# Patient Record
Sex: Male | Born: 1945 | Race: Black or African American | Hispanic: No | Marital: Married | Smoking: Former smoker
Health system: Southern US, Community
[De-identification: ages and names within clinical notes are randomized; demographics above are authoritative.]

## PROBLEM LIST (undated history)

## (undated) DIAGNOSIS — N289 Disorder of kidney and ureter, unspecified: Secondary | ICD-10-CM

## (undated) DIAGNOSIS — Z5189 Encounter for other specified aftercare: Secondary | ICD-10-CM

## (undated) DIAGNOSIS — I10 Essential (primary) hypertension: Secondary | ICD-10-CM

## (undated) DIAGNOSIS — N390 Urinary tract infection, site not specified: Secondary | ICD-10-CM

## (undated) DIAGNOSIS — E119 Type 2 diabetes mellitus without complications: Secondary | ICD-10-CM

## (undated) HISTORY — PX: BRAIN SURGERY: SHX531

## (undated) HISTORY — PX: NEPHRECTOMY TRANSPLANTED ORGAN: SUR880

---

## 2018-01-26 ENCOUNTER — Inpatient Hospital Stay (HOSPITAL_COMMUNITY)
Admission: EM | Admit: 2018-01-26 | Discharge: 2018-01-30 | DRG: 872 | Disposition: A | Discharge status: Skilled Nursing Facility | Payer: Medicare Other | Attending: Internal Medicine | Admitting: Internal Medicine

## 2018-01-26 ENCOUNTER — Emergency Department (HOSPITAL_COMMUNITY): Payer: Medicare Other

## 2018-01-26 ENCOUNTER — Encounter (HOSPITAL_COMMUNITY): Payer: Self-pay

## 2018-01-26 ENCOUNTER — Other Ambulatory Visit: Payer: Self-pay

## 2018-01-26 DIAGNOSIS — Z7952 Long term (current) use of systemic steroids: Secondary | ICD-10-CM

## 2018-01-26 DIAGNOSIS — Z91013 Allergy to seafood: Secondary | ICD-10-CM

## 2018-01-26 DIAGNOSIS — N3000 Acute cystitis without hematuria: Secondary | ICD-10-CM

## 2018-01-26 DIAGNOSIS — Z940 Kidney transplant status: Secondary | ICD-10-CM

## 2018-01-26 DIAGNOSIS — Y830 Surgical operation with transplant of whole organ as the cause of abnormal reaction of the patient, or of later complication, without mention of misadventure at the time of the procedure: Secondary | ICD-10-CM | POA: Diagnosis present

## 2018-01-26 DIAGNOSIS — E1129 Type 2 diabetes mellitus with other diabetic kidney complication: Secondary | ICD-10-CM | POA: Diagnosis present

## 2018-01-26 DIAGNOSIS — D62 Acute posthemorrhagic anemia: Secondary | ICD-10-CM | POA: Diagnosis present

## 2018-01-26 DIAGNOSIS — A419 Sepsis, unspecified organism: Secondary | ICD-10-CM | POA: Diagnosis present

## 2018-01-26 DIAGNOSIS — E1121 Type 2 diabetes mellitus with diabetic nephropathy: Secondary | ICD-10-CM

## 2018-01-26 DIAGNOSIS — T8613 Kidney transplant infection: Secondary | ICD-10-CM | POA: Diagnosis present

## 2018-01-26 DIAGNOSIS — J90 Pleural effusion, not elsewhere classified: Secondary | ICD-10-CM | POA: Diagnosis present

## 2018-01-26 DIAGNOSIS — I10 Essential (primary) hypertension: Secondary | ICD-10-CM | POA: Diagnosis not present

## 2018-01-26 DIAGNOSIS — E875 Hyperkalemia: Secondary | ICD-10-CM | POA: Diagnosis present

## 2018-01-26 DIAGNOSIS — Z87891 Personal history of nicotine dependence: Secondary | ICD-10-CM

## 2018-01-26 DIAGNOSIS — R319 Hematuria, unspecified: Secondary | ICD-10-CM | POA: Diagnosis present

## 2018-01-26 DIAGNOSIS — S42009A Fracture of unspecified part of unspecified clavicle, initial encounter for closed fracture: Secondary | ICD-10-CM

## 2018-01-26 DIAGNOSIS — I129 Hypertensive chronic kidney disease with stage 1 through stage 4 chronic kidney disease, or unspecified chronic kidney disease: Secondary | ICD-10-CM | POA: Diagnosis present

## 2018-01-26 DIAGNOSIS — M109 Gout, unspecified: Secondary | ICD-10-CM | POA: Diagnosis present

## 2018-01-26 DIAGNOSIS — D638 Anemia in other chronic diseases classified elsewhere: Secondary | ICD-10-CM | POA: Diagnosis present

## 2018-01-26 DIAGNOSIS — T8619 Other complication of kidney transplant: Secondary | ICD-10-CM | POA: Diagnosis present

## 2018-01-26 DIAGNOSIS — Z792 Long term (current) use of antibiotics: Secondary | ICD-10-CM

## 2018-01-26 DIAGNOSIS — Z79899 Other long term (current) drug therapy: Secondary | ICD-10-CM | POA: Diagnosis not present

## 2018-01-26 DIAGNOSIS — Z794 Long term (current) use of insulin: Secondary | ICD-10-CM

## 2018-01-26 DIAGNOSIS — S065X9D Traumatic subdural hemorrhage with loss of consciousness of unspecified duration, subsequent encounter: Secondary | ICD-10-CM | POA: Diagnosis not present

## 2018-01-26 DIAGNOSIS — N390 Urinary tract infection, site not specified: Secondary | ICD-10-CM

## 2018-01-26 DIAGNOSIS — N12 Tubulo-interstitial nephritis, not specified as acute or chronic: Secondary | ICD-10-CM | POA: Diagnosis present

## 2018-01-26 DIAGNOSIS — Z91018 Allergy to other foods: Secondary | ICD-10-CM | POA: Diagnosis not present

## 2018-01-26 DIAGNOSIS — N184 Chronic kidney disease, stage 4 (severe): Secondary | ICD-10-CM | POA: Diagnosis present

## 2018-01-26 DIAGNOSIS — S42122D Displaced fracture of acromial process, left shoulder, subsequent encounter for fracture with routine healing: Secondary | ICD-10-CM

## 2018-01-26 DIAGNOSIS — S22009D Unspecified fracture of unspecified thoracic vertebra, subsequent encounter for fracture with routine healing: Secondary | ICD-10-CM

## 2018-01-26 DIAGNOSIS — N179 Acute kidney failure, unspecified: Secondary | ICD-10-CM | POA: Diagnosis present

## 2018-01-26 DIAGNOSIS — E1122 Type 2 diabetes mellitus with diabetic chronic kidney disease: Secondary | ICD-10-CM | POA: Diagnosis present

## 2018-01-26 DIAGNOSIS — Z7982 Long term (current) use of aspirin: Secondary | ICD-10-CM | POA: Diagnosis not present

## 2018-01-26 HISTORY — DX: Disorder of kidney and ureter, unspecified: N28.9

## 2018-01-26 HISTORY — DX: Type 2 diabetes mellitus without complications: E11.9

## 2018-01-26 HISTORY — DX: Essential (primary) hypertension: I10

## 2018-01-26 HISTORY — DX: Encounter for other specified aftercare: Z51.89

## 2018-01-26 LAB — BASIC METABOLIC PANEL
Anion gap: 11 (ref 5–15)
BUN: 63 mg/dL — AB (ref 6–20)
CO2: 19 mmol/L — ABNORMAL LOW (ref 22–32)
CREATININE: 3.15 mg/dL — AB (ref 0.61–1.24)
Calcium: 10.1 mg/dL (ref 8.9–10.3)
Chloride: 105 mmol/L (ref 101–111)
GFR calc Af Amer: 21 mL/min — ABNORMAL LOW (ref >60)
GFR calc non Af Amer: 18 mL/min — ABNORMAL LOW (ref >60)
Glucose, Bld: 243 mg/dL — ABNORMAL HIGH (ref 65–99)
POTASSIUM: 6 mmol/L — AB (ref 3.5–5.1)
SODIUM: 135 mmol/L (ref 135–145)

## 2018-01-26 LAB — URINALYSIS, ROUTINE W REFLEX MICROSCOPIC
Bilirubin Urine: NEGATIVE
Glucose, UA: 50 mg/dL — AB
Ketones, ur: NEGATIVE mg/dL
Nitrite: NEGATIVE
PROTEIN: 100 mg/dL — AB
Specific Gravity, Urine: 1.015 (ref 1.005–1.030)
pH: 5 (ref 5.0–8.0)

## 2018-01-26 LAB — I-STAT CG4 LACTIC ACID, ED: Lactic Acid, Venous: 1.47 mmol/L (ref 0.5–1.9)

## 2018-01-26 LAB — CBC
HEMATOCRIT: 26.8 % — AB (ref 39.0–52.0)
Hemoglobin: 8.9 g/dL — ABNORMAL LOW (ref 13.0–17.0)
MCH: 23.5 pg — ABNORMAL LOW (ref 26.0–34.0)
MCHC: 33.2 g/dL (ref 30.0–36.0)
MCV: 70.7 fL — ABNORMAL LOW (ref 78.0–100.0)
PLATELETS: 134 10*3/uL — AB (ref 150–400)
RBC: 3.79 MIL/uL — ABNORMAL LOW (ref 4.22–5.81)
RDW: 22.9 % — AB (ref 11.5–15.5)
WBC: 19.4 10*3/uL — AB (ref 4.0–10.5)

## 2018-01-26 MED ORDER — SODIUM CHLORIDE 0.9 % IV SOLN
2.0000 g | INTRAVENOUS | Status: DC
  Administered 2018-01-26 – 2018-01-28 (×3): 2 g via INTRAVENOUS
  Filled 2018-01-26 (×4): qty 20

## 2018-01-26 MED ORDER — SODIUM CHLORIDE 0.9 % IV BOLUS
500.0000 mL | Freq: Once | INTRAVENOUS | Status: AC
  Administered 2018-01-26: 500 mL via INTRAVENOUS

## 2018-01-26 MED ORDER — ACETAMINOPHEN 325 MG PO TABS
650.0000 mg | ORAL_TABLET | Freq: Once | ORAL | Status: AC
Start: 2018-01-26 — End: 2018-01-26
  Administered 2018-01-26: 650 mg via ORAL
  Filled 2018-01-26: qty 2

## 2018-01-26 MED ORDER — ONDANSETRON HCL 4 MG/2ML IJ SOLN
4.0000 mg | Freq: Once | INTRAMUSCULAR | Status: AC
  Administered 2018-01-26: 4 mg via INTRAVENOUS
  Filled 2018-01-26: qty 2

## 2018-01-26 MED ORDER — SODIUM CHLORIDE 0.9 % IV BOLUS
1000.0000 mL | Freq: Once | INTRAVENOUS | Status: AC
  Administered 2018-01-26: 1000 mL via INTRAVENOUS

## 2018-01-26 NOTE — ED Provider Notes (Signed)
[facility] EMERGENCY DEPARTMENT Provider Note   CSN: [id number] Arrival date & time: 01/26/18  1609     History   Chief Complaint Chief Complaint  Patient presents with  . Flank Pain  . Dysuria    HPI Steven Chambers is a 72 y.o. male presenting for evaluation of dysuria.  Patient states for the past 2 days, he has been having urinary symptoms including dysuria and urinary frequency.  He states that when he is urinating, not much is coming out.  They attempted to put a Foley in at [facility], and since then he has had blood in his urine.  No blood previously.  He reports nausea without vomiting, has not been able to tolerate p.o. for the past day.  He denies fevers.  He has a history of renal transplant on immunosuppression since 2009.  He had dialysis prior to that, but no dialysis since.  He denies chest pain, shortness of breath, abdominal pain, or abnormal bowel movements.  He states when he is having pain, it is of his penis.  No flank pain.  Patient given dose of Cipro for presumed UTI prior to leaving for the ED.   HPI  Past Medical History:  Diagnosis Date  . Blood transfusion without reported diagnosis   . Diabetes mellitus without complication (HCC)   . Hypertension   . Renal disorder     There are no active problems to display for this patient.   Past Surgical History:  Procedure Laterality Date  . BRAIN SURGERY    . NEPHRECTOMY TRANSPLANTED ORGAN          Home Medications    Prior to Admission medications   Not on File    Family History No family history on file.  Social History Social History   Tobacco Use  . Smoking status: Former Smoker    Last attempt to quit: 1985    Years since quitting: 34.4  . Smokeless tobacco: Never Used  Substance Use Topics  . Alcohol use: Not Currently  . Drug use: Never     Allergies   Shellfish allergy   Review of Systems Review of Systems  Gastrointestinal: Positive for nausea.    Genitourinary: Positive for dysuria and frequency.  Allergic/Immunologic: Positive for immunocompromised state.  All other systems reviewed and are negative.    Physical Exam Updated Vital Signs BP (!) 169/99   Pulse (!) 121   Temp 99.1 F (37.3 C) (Oral)   Resp 20   Ht 5\' 9"  (1.753 m)   Wt 77.1 kg (170 lb)   SpO2 96%   BMI 25.10 kg/m   Physical Exam  Constitutional: He is oriented to person, place, and time. He appears well-developed and well-nourished. No distress.  Appears in no distress  HENT:  Head: Normocephalic and atraumatic.  Eyes: Pupils are equal, round, and reactive to light. Conjunctivae and EOM are normal.  Neck: Normal range of motion. Neck supple.  Cardiovascular: Normal rate, regular rhythm and intact distal pulses.  Pulmonary/Chest: Effort normal and breath sounds normal. No respiratory distress. He has no wheezes.  Abdominal: Soft. He exhibits no distension and no mass. There is no tenderness. There is no guarding.  No tenderness to palpation of the abdomen.  Soft without rigidity, guarding, or distention.  Musculoskeletal: Normal range of motion.  Neurological: He is alert and oriented to person, place, and time.  Skin: Skin is warm and dry.  Psychiatric: He has a normal mood and affect.  Nursing note and vitals reviewed.    ED Treatments / Results  Labs (all labs ordered are listed, but only abnormal results are displayed) Labs Reviewed  BASIC METABOLIC PANEL - Abnormal; Notable for the following components:      Result Value   Potassium 6.0 (*)    CO2 19 (*)    Glucose, Bld 243 (*)    BUN 63 (*)    Creatinine, Ser 3.15 (*)    GFR calc non Af Amer 18 (*)    GFR calc Af Amer 21 (*)    All other components within normal limits  CBC - Abnormal; Notable for the following components:   WBC 19.4 (*)    RBC 3.79 (*)    Hemoglobin 8.9 (*)    HCT 26.8 (*)    MCV 70.7 (*)    MCH 23.5 (*)    RDW 22.9 (*)    Platelets 134 (*)    All other  components within normal limits  URINE CULTURE  CULTURE, BLOOD (ROUTINE X 2)  CULTURE, BLOOD (ROUTINE X 2)  URINALYSIS, ROUTINE W REFLEX MICROSCOPIC  I-STAT CG4 LACTIC ACID, ED    EKG None  Radiology No results found.  Procedures .Critical Care Performed by: Alveria Apleyaccavale, Paola Flynt, PA-C Authorized by: Alveria Apleyaccavale, Lesa Vandall, PA-C   Critical care provider statement:    Critical care time (minutes):  40   Critical care time was exclusive of:  Separately billable procedures and treating other patients and teaching time   Critical care was necessary to treat or prevent imminent or life-threatening deterioration of the following conditions:  Sepsis   Critical care was time spent personally by me on the following activities:  Blood draw for specimens, development of treatment plan with patient or surrogate, discussions with consultants, evaluation of patient's response to treatment, examination of patient, obtaining history from patient or surrogate, ordering and performing treatments and interventions, ordering and review of laboratory studies, ordering and review of radiographic studies, pulse oximetry, re-evaluation of patient's condition and review of old charts   I assumed direction of critical care for this patient from another provider in my specialty: no   Comments:     Pt presenting septic. Fluids and abx started   (including critical care time)  Medications Ordered in ED Medications  sodium chloride 0.9 % bolus 1,000 mL (has no administration in time range)  acetaminophen (TYLENOL) tablet 650 mg (has no administration in time range)  cefTRIAXone (ROCEPHIN) 2 g in sodium chloride 0.9 % 100 mL IVPB (has no administration in time range)  ondansetron (ZOFRAN) injection 4 mg (4 mg Intravenous Given 01/26/18 1819)  sodium chloride 0.9 % bolus 500 mL (500 mLs Intravenous New Bag/Given 01/26/18 1819)     Initial Impression / Assessment and Plan / ED Course  I have reviewed the triage vital  signs and the nursing notes.  Pertinent labs & imaging results that were available during my care of the patient were reviewed by me and considered in my medical decision making (see chart for details).     Patient resenting for evaluation of dysuria and urinary frequency.  Physical exam shows elderly male who appears in no distress.  He is afebrile not tachycardic.  He appears nontoxic.  Abdominal exam reassuring.  Will obtain labs, urine, and start some IV fluids.  Obtain bladder scan due to decreased urination despite frequency. Zofran for nausea.  Bladder scans are 70 mL's.  Labs concerning, white count elevated at 19.4.  Baseline is 8.  Hemoglobin low at 8.9, although this appears to be baseline for patient.  Potassium elevated at 6.0.  Creatinine elevated at 3.15, baseline is 2.8.  Glucose elevated at 243, this appears to be baseline per chart review.  Reassessment of the patient, he is tachycardic and feels hot.  Had an oral temperature of 100.5.  He has been dry heaving.  I am concerned about worsening urinary infection, causing elevation in creatinine.  As patient is immunocompromised and has a renal transplant, he is at higher risk.  Urine has not yet been able to be obtained.  Will give another bolus of fluid and encourage urination.  Will hold off on cath at this time, as patient had recent traumatic cath causing bleeding.  Will obtain lactic and call code sepsis.  Case discussed with attending, Dr. Criss Alvine agrees to plan.   On reassessment, patient appears more comfortable.  He is no longer sweaty, and no vomiting for the past hour.  Medications have been started.  Urine pending. Rectal temp 102.4.  Discussed with hospitalist, patient to be admitted.  Requesting CT abdomen without contrast and further fluid bolus.   Final Clinical Impressions(s) / ED Diagnoses   Final diagnoses:  Sepsis, due to unspecified organism [facility])  Urinary tract infection without hematuria, site unspecified      ED Discharge Orders    None       Alveria Apley, PA-C 01/26/18 2224    Pricilla Loveless, MD 01/28/18 1455
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
## 2018-01-26 NOTE — ED Notes (Signed)
Bladder scan 70 ml

## 2018-01-27 ENCOUNTER — Encounter (HOSPITAL_COMMUNITY): Payer: Self-pay | Admitting: Internal Medicine

## 2018-01-27 ENCOUNTER — Inpatient Hospital Stay (HOSPITAL_COMMUNITY): Payer: Medicare Other

## 2018-01-27 DIAGNOSIS — E1121 Type 2 diabetes mellitus with diabetic nephropathy: Secondary | ICD-10-CM

## 2018-01-27 DIAGNOSIS — A419 Sepsis, unspecified organism: Principal | ICD-10-CM

## 2018-01-27 DIAGNOSIS — D649 Anemia, unspecified: Secondary | ICD-10-CM

## 2018-01-27 DIAGNOSIS — N179 Acute kidney failure, unspecified: Secondary | ICD-10-CM | POA: Diagnosis present

## 2018-01-27 DIAGNOSIS — E875 Hyperkalemia: Secondary | ICD-10-CM

## 2018-01-27 DIAGNOSIS — E1129 Type 2 diabetes mellitus with other diabetic kidney complication: Secondary | ICD-10-CM | POA: Diagnosis present

## 2018-01-27 DIAGNOSIS — N390 Urinary tract infection, site not specified: Secondary | ICD-10-CM

## 2018-01-27 DIAGNOSIS — I10 Essential (primary) hypertension: Secondary | ICD-10-CM

## 2018-01-27 DIAGNOSIS — Z940 Kidney transplant status: Secondary | ICD-10-CM

## 2018-01-27 DIAGNOSIS — N3000 Acute cystitis without hematuria: Secondary | ICD-10-CM

## 2018-01-27 LAB — PROCALCITONIN

## 2018-01-27 LAB — COMPREHENSIVE METABOLIC PANEL
ALBUMIN: 2.8 g/dL — AB (ref 3.5–5.0)
ALK PHOS: 50 U/L (ref 38–126)
ALT: 27 U/L (ref 17–63)
AST: 29 U/L (ref 15–41)
Anion gap: 10 (ref 5–15)
BUN: 23 mg/dL — ABNORMAL HIGH (ref 6–20)
CALCIUM: 8.8 mg/dL — AB (ref 8.9–10.3)
CO2: 23 mmol/L (ref 22–32)
Chloride: 98 mmol/L — ABNORMAL LOW (ref 101–111)
Creatinine, Ser: 0.91 mg/dL (ref 0.61–1.24)
GFR calc Af Amer: >60 mL/min (ref >60)
GFR calc non Af Amer: >60 mL/min (ref >60)
GLUCOSE: 94 mg/dL (ref 65–99)
Potassium: 4.4 mmol/L (ref 3.5–5.1)
SODIUM: 131 mmol/L — AB (ref 135–145)
Total Bilirubin: 0.6 mg/dL (ref 0.3–1.2)
Total Protein: 5.9 g/dL — ABNORMAL LOW (ref 6.5–8.1)

## 2018-01-27 LAB — CBC WITH DIFFERENTIAL/PLATELET
BASOS ABS: 0 10*3/uL (ref 0.0–0.1)
Basophils Relative: 0 %
EOS ABS: 0.2 10*3/uL (ref 0.0–0.7)
Eosinophils Relative: 1 %
HCT: 19.4 % — ABNORMAL LOW (ref 39.0–52.0)
HEMOGLOBIN: 6.5 g/dL — AB (ref 13.0–17.0)
LYMPHS PCT: 8 %
Lymphs Abs: 1.2 10*3/uL (ref 0.7–4.0)
MCH: 23.9 pg — ABNORMAL LOW (ref 26.0–34.0)
MCHC: 33.5 g/dL (ref 30.0–36.0)
MCV: 71.3 fL — ABNORMAL LOW (ref 78.0–100.0)
Monocytes Absolute: 1.6 10*3/uL — ABNORMAL HIGH (ref 0.1–1.0)
Monocytes Relative: 10 %
NEUTROS ABS: 12.6 10*3/uL — AB (ref 1.7–7.7)
Neutrophils Relative %: 81 %
PLATELETS: 100 10*3/uL — AB (ref 150–400)
RBC: 2.72 MIL/uL — ABNORMAL LOW (ref 4.22–5.81)
RDW: 23.1 % — ABNORMAL HIGH (ref 11.5–15.5)
WBC: 15.6 10*3/uL — ABNORMAL HIGH (ref 4.0–10.5)

## 2018-01-27 LAB — IRON AND TIBC
IRON: 20 ug/dL — AB (ref 45–182)
Saturation Ratios: 13 % — ABNORMAL LOW (ref 17.9–39.5)
TIBC: 157 ug/dL — AB (ref 250–450)
UIBC: 137 ug/dL

## 2018-01-27 LAB — GLUCOSE, CAPILLARY
GLUCOSE-CAPILLARY: 133 mg/dL — AB (ref 65–99)
GLUCOSE-CAPILLARY: 160 mg/dL — AB (ref 65–99)
GLUCOSE-CAPILLARY: 226 mg/dL — AB (ref 65–99)
Glucose-Capillary: 189 mg/dL — ABNORMAL HIGH (ref 65–99)

## 2018-01-27 LAB — APTT: APTT: 36 s (ref 24–36)

## 2018-01-27 LAB — RETICULOCYTES
RBC.: 2.8 MIL/uL — ABNORMAL LOW (ref 4.22–5.81)
Retic Count, Absolute: 42 10*3/uL (ref 19.0–186.0)
Retic Ct Pct: 1.5 % (ref 0.4–3.1)

## 2018-01-27 LAB — FERRITIN: Ferritin: 1378 ng/mL — ABNORMAL HIGH (ref 24–336)

## 2018-01-27 LAB — HEMOGLOBIN AND HEMATOCRIT, BLOOD
HCT: 19.1 % — ABNORMAL LOW (ref 39.0–52.0)
HEMOGLOBIN: 6.4 g/dL — AB (ref 13.0–17.0)

## 2018-01-27 LAB — PREPARE RBC (CROSSMATCH)

## 2018-01-27 LAB — PROTIME-INR
INR: 1.71
Prothrombin Time: 20 seconds — ABNORMAL HIGH (ref 11.4–15.2)

## 2018-01-27 LAB — LACTIC ACID, PLASMA
Lactic Acid, Venous: 1.7 mmol/L (ref 0.5–1.9)
Lactic Acid, Venous: 0.6 mmol/L (ref 0.5–1.9)

## 2018-01-27 LAB — CORTISOL: CORTISOL PLASMA: 2 ug/dL

## 2018-01-27 LAB — ABO/RH: ABO/RH(D): B POS

## 2018-01-27 LAB — VITAMIN B12: VITAMIN B 12: 754 pg/mL (ref 180–914)

## 2018-01-27 LAB — MRSA PCR SCREENING: MRSA BY PCR: POSITIVE — AB

## 2018-01-27 LAB — FOLATE: FOLATE: 14.9 ng/mL (ref >5.9)

## 2018-01-27 MED ORDER — INSULIN DETEMIR 100 UNIT/ML ~~LOC~~ SOLN
20.0000 [IU] | Freq: Every day | SUBCUTANEOUS | Status: DC
  Administered 2018-01-27 – 2018-01-30 (×4): 20 [IU] via SUBCUTANEOUS
  Filled 2018-01-27 (×5): qty 0.2

## 2018-01-27 MED ORDER — METHOCARBAMOL 500 MG PO TABS
500.0000 mg | ORAL_TABLET | Freq: Two times a day (BID) | ORAL | Status: DC
  Administered 2018-01-27 – 2018-01-30 (×8): 500 mg via ORAL
  Filled 2018-01-27 (×8): qty 1

## 2018-01-27 MED ORDER — FEBUXOSTAT 40 MG PO TABS
40.0000 mg | ORAL_TABLET | Freq: Every day | ORAL | Status: DC
  Administered 2018-01-27 – 2018-01-30 (×4): 40 mg via ORAL
  Filled 2018-01-27 (×5): qty 1

## 2018-01-27 MED ORDER — HYDROCORTISONE NA SUCCINATE PF 100 MG IJ SOLR
50.0000 mg | Freq: Four times a day (QID) | INTRAMUSCULAR | Status: DC
  Administered 2018-01-27 – 2018-01-29 (×9): 50 mg via INTRAVENOUS
  Filled 2018-01-27 (×9): qty 2

## 2018-01-27 MED ORDER — DOCUSATE SODIUM 100 MG PO CAPS
100.0000 mg | ORAL_CAPSULE | Freq: Two times a day (BID) | ORAL | Status: DC
  Administered 2018-01-27 – 2018-01-30 (×8): 100 mg via ORAL
  Filled 2018-01-27 (×8): qty 1

## 2018-01-27 MED ORDER — ASPIRIN 81 MG PO CHEW: 81.0000 mg | CHEWABLE_TABLET | Freq: Every day | ORAL | Status: DC

## 2018-01-27 MED ORDER — VITAMIN C 500 MG PO TABS
250.0000 mg | ORAL_TABLET | Freq: Two times a day (BID) | ORAL | Status: DC
  Administered 2018-01-27 – 2018-01-30 (×8): 250 mg via ORAL
  Filled 2018-01-27 (×8): qty 1

## 2018-01-27 MED ORDER — ACETAMINOPHEN 325 MG PO TABS
650.0000 mg | ORAL_TABLET | Freq: Four times a day (QID) | ORAL | Status: DC | PRN
  Administered 2018-01-28: 650 mg via ORAL
  Filled 2018-01-27: qty 2

## 2018-01-27 MED ORDER — SODIUM CHLORIDE 0.9 % IV SOLN
INTRAVENOUS | Status: AC
  Administered 2018-01-27: 03:00:00 via INTRAVENOUS

## 2018-01-27 MED ORDER — PANTOPRAZOLE SODIUM 40 MG PO TBEC
40.0000 mg | DELAYED_RELEASE_TABLET | Freq: Every day | ORAL | Status: DC
  Administered 2018-01-27 – 2018-01-30 (×4): 40 mg via ORAL
  Filled 2018-01-27 (×4): qty 1

## 2018-01-27 MED ORDER — SODIUM CHLORIDE 0.9 % IV SOLN
Freq: Once | INTRAVENOUS | Status: AC
  Administered 2018-01-27: 12:00:00 via INTRAVENOUS

## 2018-01-27 MED ORDER — HEPARIN SODIUM (PORCINE) 5000 UNIT/ML IJ SOLN
5000.0000 [IU] | Freq: Three times a day (TID) | INTRAMUSCULAR | Status: DC
  Administered 2018-01-27: 5000 [IU] via SUBCUTANEOUS
  Filled 2018-01-27: qty 1

## 2018-01-27 MED ORDER — MONTELUKAST SODIUM 10 MG PO TABS
10.0000 mg | ORAL_TABLET | Freq: Every day | ORAL | Status: DC
  Administered 2018-01-27 – 2018-01-30 (×4): 10 mg via ORAL
  Filled 2018-01-27 (×4): qty 1

## 2018-01-27 MED ORDER — ACETAMINOPHEN 650 MG RE SUPP: 650.0000 mg | Freq: Four times a day (QID) | RECTAL | Status: DC | PRN

## 2018-01-27 MED ORDER — ONDANSETRON HCL 4 MG/2ML IJ SOLN
4.0000 mg | Freq: Four times a day (QID) | INTRAMUSCULAR | Status: DC | PRN
  Administered 2018-01-30: 4 mg via INTRAVENOUS
  Filled 2018-01-27: qty 2

## 2018-01-27 MED ORDER — ONDANSETRON HCL 4 MG PO TABS
4.0000 mg | ORAL_TABLET | Freq: Four times a day (QID) | ORAL | Status: DC | PRN
  Administered 2018-01-27 – 2018-01-29 (×4): 4 mg via ORAL
  Filled 2018-01-27 (×4): qty 1

## 2018-01-27 MED ORDER — MYCOPHENOLATE MOFETIL 250 MG PO CAPS
500.0000 mg | ORAL_CAPSULE | Freq: Two times a day (BID) | ORAL | Status: DC
  Administered 2018-01-27 – 2018-01-30 (×8): 500 mg via ORAL
  Filled 2018-01-27 (×8): qty 2

## 2018-01-27 MED ORDER — DILTIAZEM HCL ER COATED BEADS 120 MG PO CP24
240.0000 mg | ORAL_CAPSULE | Freq: Two times a day (BID) | ORAL | Status: DC
  Administered 2018-01-27 – 2018-01-30 (×8): 240 mg via ORAL
  Filled 2018-01-27 (×9): qty 2

## 2018-01-27 MED ORDER — LABETALOL HCL 300 MG PO TABS
300.0000 mg | ORAL_TABLET | Freq: Two times a day (BID) | ORAL | Status: DC
  Administered 2018-01-27 – 2018-01-30 (×8): 300 mg via ORAL
  Filled 2018-01-27 (×8): qty 1

## 2018-01-27 MED ORDER — TACROLIMUS 1 MG PO CAPS
5.0000 mg | ORAL_CAPSULE | Freq: Two times a day (BID) | ORAL | Status: DC
  Administered 2018-01-27 – 2018-01-30 (×8): 5 mg via ORAL
  Filled 2018-01-27 (×8): qty 5

## 2018-01-27 MED ORDER — INSULIN ASPART 100 UNIT/ML ~~LOC~~ SOLN
0.0000 [IU] | Freq: Three times a day (TID) | SUBCUTANEOUS | Status: DC
  Administered 2018-01-27: 2 [IU] via SUBCUTANEOUS
  Administered 2018-01-27: 1 [IU] via SUBCUTANEOUS
  Administered 2018-01-27: 2 [IU] via SUBCUTANEOUS
  Administered 2018-01-28 (×2): 5 [IU] via SUBCUTANEOUS
  Administered 2018-01-28: 3 [IU] via SUBCUTANEOUS
  Administered 2018-01-29 (×2): 5 [IU] via SUBCUTANEOUS
  Administered 2018-01-29: 9 [IU] via SUBCUTANEOUS
  Administered 2018-01-30 (×3): 2 [IU] via SUBCUTANEOUS

## 2018-01-27 MED ORDER — SENNA 8.6 MG PO TABS
1.0000 | ORAL_TABLET | Freq: Every day | ORAL | Status: DC
  Administered 2018-01-27 – 2018-01-30 (×4): 8.6 mg via ORAL
  Filled 2018-01-27 (×4): qty 1

## 2018-01-27 MED ORDER — BISACODYL 10 MG RE SUPP: 10.0000 mg | RECTAL | Status: DC | PRN

## 2018-01-27 MED ORDER — SODIUM POLYSTYRENE SULFONATE 15 GM/60ML PO SUSP
15.0000 g | ORAL | Status: DC
  Administered 2018-01-29: 15 g via ORAL
  Filled 2018-01-27: qty 60

## 2018-01-27 MED ORDER — FERROUS SULFATE 325 (65 FE) MG PO TABS
325.0000 mg | ORAL_TABLET | Freq: Two times a day (BID) | ORAL | Status: DC
  Administered 2018-01-27 – 2018-01-30 (×8): 325 mg via ORAL
  Filled 2018-01-27 (×8): qty 1

## 2018-01-27 MED ORDER — OXYCODONE HCL 5 MG PO TABS
5.0000 mg | ORAL_TABLET | Freq: Four times a day (QID) | ORAL | Status: DC | PRN
  Administered 2018-01-27 – 2018-01-30 (×6): 5 mg via ORAL
  Filled 2018-01-27 (×6): qty 1

## 2018-01-27 NOTE — Progress Notes (Signed)
New Admission Note:  Arrival Method: By bed from ED around 2230 Mental Orientation: Alert and oriented Telemetry: Box 20, CCMD notified Assessment: Completed Skin: Completed, refer to flowsheets IV: Left hand S.L. Pain: Denies Tubes: None Safety Measures: Safety Fall Prevention Plan was given, discussed and signed. Admission: Completed [facility] Orientation: Patient has been orientated to the room, unit and the staff. Family: At bedside  Orders have been reviewed and implemented. Will continue to monitor the patient. Call light has been placed within reach.  Alfonse Rashristy Dollie Bressi, RN  Phone Number: [phone]
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
## 2018-01-27 NOTE — Progress Notes (Signed)
Messaged [facility] again to notify pt on floor and family wanting to see MD.   Larey Dayshristy M Kinsie Belford, RN
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
## 2018-01-28 LAB — BPAM RBC
BLOOD PRODUCT EXPIRATION DATE: 201907082359
BLOOD PRODUCT EXPIRATION DATE: 201907082359
ISSUE DATE / TIME: 201906151348
ISSUE DATE / TIME: 201906152014
UNIT TYPE AND RH: 7300
Unit Type and Rh: 7300

## 2018-01-28 LAB — TYPE AND SCREEN
ABO/RH(D): B POS
ANTIBODY SCREEN: NEGATIVE
Unit division: 0
Unit division: 0

## 2018-01-28 LAB — CBC
HCT: 26.3 % — ABNORMAL LOW (ref 39.0–52.0)
HEMOGLOBIN: 8.7 g/dL — AB (ref 13.0–17.0)
MCH: 24 pg — ABNORMAL LOW (ref 26.0–34.0)
MCHC: 33.1 g/dL (ref 30.0–36.0)
MCV: 72.7 fL — ABNORMAL LOW (ref 78.0–100.0)
Platelets: 109 10*3/uL — ABNORMAL LOW (ref 150–400)
RBC: 3.62 MIL/uL — ABNORMAL LOW (ref 4.22–5.81)
RDW: 22.9 % — ABNORMAL HIGH (ref 11.5–15.5)
WBC: 13.2 10*3/uL — ABNORMAL HIGH (ref 4.0–10.5)

## 2018-01-28 LAB — BASIC METABOLIC PANEL
ANION GAP: 10 (ref 5–15)
BUN: 60 mg/dL — ABNORMAL HIGH (ref 6–20)
CALCIUM: 9.2 mg/dL (ref 8.9–10.3)
CO2: 15 mmol/L — ABNORMAL LOW (ref 22–32)
Chloride: 112 mmol/L — ABNORMAL HIGH (ref 101–111)
Creatinine, Ser: 2.81 mg/dL — ABNORMAL HIGH (ref 0.61–1.24)
GFR calc non Af Amer: 21 mL/min — ABNORMAL LOW (ref >60)
GFR, EST AFRICAN AMERICAN: 24 mL/min — AB (ref >60)
GLUCOSE: 220 mg/dL — AB (ref 65–99)
Potassium: 5 mmol/L (ref 3.5–5.1)
Sodium: 137 mmol/L (ref 135–145)

## 2018-01-28 LAB — URINE CULTURE: Culture: <10000 — AB

## 2018-01-28 LAB — GLUCOSE, CAPILLARY
GLUCOSE-CAPILLARY: 215 mg/dL — AB (ref 65–99)
GLUCOSE-CAPILLARY: 295 mg/dL — AB (ref 65–99)
Glucose-Capillary: 273 mg/dL — ABNORMAL HIGH (ref 65–99)
Glucose-Capillary: 301 mg/dL — ABNORMAL HIGH (ref 65–99)

## 2018-01-28 MED ORDER — INSULIN ASPART 100 UNIT/ML ~~LOC~~ SOLN
7.0000 [IU] | Freq: Once | SUBCUTANEOUS | Status: AC
  Administered 2018-01-28: 7 [IU] via SUBCUTANEOUS
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
## 2018-01-29 ENCOUNTER — Inpatient Hospital Stay (HOSPITAL_COMMUNITY): Payer: Medicare Other

## 2018-01-29 LAB — CBC
HCT: 26 % — ABNORMAL LOW (ref 39.0–52.0)
HEMOGLOBIN: 8.8 g/dL — AB (ref 13.0–17.0)
MCH: 24.4 pg — AB (ref 26.0–34.0)
MCHC: 33.8 g/dL (ref 30.0–36.0)
MCV: 72.2 fL — ABNORMAL LOW (ref 78.0–100.0)
Platelets: 110 10*3/uL — ABNORMAL LOW (ref 150–400)
RBC: 3.6 MIL/uL — AB (ref 4.22–5.81)
RDW: 22.7 % — ABNORMAL HIGH (ref 11.5–15.5)
WBC: 11.5 10*3/uL — ABNORMAL HIGH (ref 4.0–10.5)

## 2018-01-29 LAB — GLUCOSE, CAPILLARY
GLUCOSE-CAPILLARY: 256 mg/dL — AB (ref 65–99)
Glucose-Capillary: 352 mg/dL — ABNORMAL HIGH (ref 65–99)
Glucose-Capillary: 271 mg/dL — ABNORMAL HIGH (ref 65–99)
Glucose-Capillary: 170 mg/dL — ABNORMAL HIGH (ref 65–99)

## 2018-01-29 LAB — BASIC METABOLIC PANEL
ANION GAP: 9 (ref 5–15)
BUN: 60 mg/dL — ABNORMAL HIGH (ref 6–20)
CALCIUM: 9.1 mg/dL (ref 8.9–10.3)
CO2: 14 mmol/L — AB (ref 22–32)
Chloride: 114 mmol/L — ABNORMAL HIGH (ref 101–111)
Creatinine, Ser: 2.75 mg/dL — ABNORMAL HIGH (ref 0.61–1.24)
GFR calc non Af Amer: 22 mL/min — ABNORMAL LOW (ref >60)
GFR, EST AFRICAN AMERICAN: 25 mL/min — AB (ref >60)
Glucose, Bld: 242 mg/dL — ABNORMAL HIGH (ref 65–99)
Potassium: 5.4 mmol/L — ABNORMAL HIGH (ref 3.5–5.1)
Sodium: 137 mmol/L (ref 135–145)

## 2018-01-29 MED ORDER — CHLORHEXIDINE GLUCONATE CLOTH 2 % EX PADS: 6.0000 | MEDICATED_PAD | Freq: Every day | CUTANEOUS | Status: DC

## 2018-01-29 MED ORDER — CEPHALEXIN 250 MG PO CAPS
250.0000 mg | ORAL_CAPSULE | Freq: Three times a day (TID) | ORAL | Status: DC
  Administered 2018-01-29 – 2018-01-30 (×4): 250 mg via ORAL
  Filled 2018-01-29 (×6): qty 1

## 2018-01-29 MED ORDER — PREDNISONE 5 MG PO TABS
5.0000 mg | ORAL_TABLET | Freq: Every day | ORAL | Status: DC
  Administered 2018-01-30: 5 mg via ORAL
  Filled 2018-01-29: qty 1

## 2018-01-29 MED ORDER — MUPIROCIN 2 % EX OINT
1.0000 "application " | TOPICAL_OINTMENT | Freq: Two times a day (BID) | CUTANEOUS | Status: DC
  Administered 2018-01-29 – 2018-01-30 (×2): 1 via NASAL
  Filled 2018-01-29 (×2): qty 22
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
## 2018-01-29 NOTE — Clinical Social Work Note (Signed)
CSW checked facility responses and [facility] accepted. This facility was a preference for family. [facility] and left message regarding family wanting her facility and requesting that she initiate insurance authorization as patient will be ready for discharge on Tuesday, 6/18. Call made to daughter Bjorn LoserRhonda, [phone] and advised her facilities' acceptance of patient for ST rehab. CSW will follow-up with [facility] on Tuesday, 6/18.  Genelle BalVanessa Kynsley Whitehouse, MSW, LCSW Licensed Clinical Social Worker Clinical Social Work Department [facility] [phone]

## 2018-01-29 NOTE — NC FL2 (Signed)
[place] MEDICAID FL2 LEVEL OF CARE SCREENING TOOL     IDENTIFICATION  Patient Name: Steven Chambers Birthdate: October 20, 1945 Sex: male Admission Date (Current Location): 01/26/2018  [facility] and [place] Number:  Producer, television/film/video and Address:  [facility], [address] [place], [place] [address]      Provider Number: [id number]  Attending Physician Name and Address:  Edsel Petrin, DO  Relative Name and Phone Number:  Janard Culp, wife - [phone]; Hong Moring, daughter - [phone]    Current Level of Care: SNF Recommended Level of Care: Skilled Nursing Facility Prior Approval Number:    Date Approved/Denied:   PASRR Number: [id number](eff. 01/10/18)  Discharge Plan: SNF    Current Diagnoses: Patient Active Problem List   Diagnosis Date Noted  . Urinary tract infection without hematuria 01/27/2018  . ARF (acute renal failure) (HCC) 01/27/2018  . DM (diabetes mellitus), type 2 with renal complications (HCC) 01/27/2018  . Essential hypertension 01/27/2018  . Renal transplant recipient 01/27/2018  . Hyperkalemia 01/27/2018  . Sepsis (HCC) 01/26/2018    Orientation RESPIRATION BLADDER Height & Weight     Self, Time, Situation, Place  Normal Continent Weight: 171 lb 11.8 oz (77.9 kg) Height:  5\' 9"  (175.3 cm)  BEHAVIORAL SYMPTOMS/MOOD NEUROLOGICAL BOWEL NUTRITION STATUS      Continent Diet(Heart healthy)  AMBULATORY STATUS COMMUNICATION OF NEEDS Skin   Limited Assist Verbally                         Personal Care Assistance Level of Assistance  Bathing, Feeding, Dressing Bathing Assistance: Limited assistance Feeding assistance: Independent Dressing Assistance: Limited assistance     Functional Limitations Info  Sight, Hearing, Speech Sight Info: Adequate Hearing Info: Adequate Speech Info: Adequate    SPECIAL CARE FACTORS FREQUENCY  PT (By licensed PT)     PT Frequency: Evaluated 6/17 and a minimum of 2X  per week therapy recommended during acute inpartient stay              Contractures Contractures Info: Not present    Additional Factors Info  Code Status, Allergies, Insulin Sliding Scale Code Status Info: Full Allergies Info: Shellfish, Grapefruit extract   Insulin Sliding Scale Info: 0-9 Units 3 times daily with meals       Current Medications (01/29/2018):  This is the current hospital active medication list Current Facility-Administered Medications  Medication Dose Route Frequency Provider Last Rate Last Dose  . acetaminophen (TYLENOL) tablet 650 mg  650 mg Oral Q6H PRN Eduard Clos, MD   650 mg at 01/28/18 1142   Or  . acetaminophen (TYLENOL) suppository 650 mg  650 mg Rectal Q6H PRN Eduard Clos, MD      . bisacodyl (DULCOLAX) suppository 10 mg  10 mg Rectal PRN Eduard Clos, MD      . cephALEXin [facility]) capsule 250 mg  250 mg Oral Q8H Mikhail, Partridge, DO   250 mg at 01/29/18 1328  . diltiazem (CARDIZEM CD) 24 hr capsule 240 mg  240 mg Oral BID Eduard Clos, MD   240 mg at 01/29/18 0945  . docusate sodium (COLACE) capsule 100 mg  100 mg Oral BID Eduard Clos, MD   100 mg at 01/29/18 0944  . febuxostat (ULORIC) tablet 40 mg  40 mg Oral Daily Eduard Clos, MD   40 mg at 01/29/18 0944  . ferrous sulfate tablet 325 mg  325 mg  Oral BID WC Eduard ClosKakrakandy, Arshad N, MD   325 mg at 01/29/18 0944  . insulin aspart (novoLOG) injection 0-9 Units  0-9 Units Subcutaneous TID WC Eduard ClosKakrakandy, Arshad N, MD   9 Units at 01/29/18 1324  . insulin detemir (LEVEMIR) injection 20 Units  20 Units Subcutaneous Daily Eduard ClosKakrakandy, Arshad N, MD   20 Units at 01/29/18 0941  . labetalol (NORMODYNE) tablet 300 mg  300 mg Oral BID Eduard ClosKakrakandy, Arshad N, MD   300 mg at 01/29/18 0944  . methocarbamol (ROBAXIN) tablet 500 mg  500 mg Oral BID Eduard ClosKakrakandy, Arshad N, MD   500 mg at 01/29/18 0944  . montelukast (SINGULAIR) tablet 10 mg  10 mg Oral Daily Eduard ClosKakrakandy, Arshad  N, MD   10 mg at 01/29/18 0943  . mycophenolate (CELLCEPT) capsule 500 mg  500 mg Oral BID Eduard ClosKakrakandy, Arshad N, MD   500 mg at 01/29/18 1326  . ondansetron (ZOFRAN) tablet 4 mg  4 mg Oral Q6H PRN Eduard ClosKakrakandy, Arshad N, MD   4 mg at 01/28/18 1828   Or  . ondansetron (ZOFRAN) injection 4 mg  4 mg Intravenous Q6H PRN Eduard ClosKakrakandy, Arshad N, MD      . oxyCODONE (Oxy IR/ROXICODONE) immediate release tablet 5 mg  5 mg Oral Q6H PRN Eduard ClosKakrakandy, Arshad N, MD   5 mg at 01/28/18 1828  . pantoprazole (PROTONIX) EC tablet 40 mg  40 mg Oral Daily Eduard ClosKakrakandy, Arshad N, MD   40 mg at 01/29/18 0944  . [START ON 01/30/2018] predniSONE (DELTASONE) tablet 5 mg  5 mg Oral Q breakfast Edsel PetrinMikhail, Maryann, DO      . senna (SENOKOT) tablet 8.6 mg  1 tablet Oral Daily Eduard ClosKakrakandy, Arshad N, MD   8.6 mg at 01/29/18 0944  . sodium polystyrene (KAYEXALATE) 15 GM/60ML suspension 15 g  15 g Oral Q M,W,F Eduard ClosKakrakandy, Arshad N, MD   15 g at 01/29/18 0948  . tacrolimus (PROGRAF) capsule 5 mg  5 mg Oral BID Eduard ClosKakrakandy, Arshad N, MD   5 mg at 01/29/18 0944  . vitamin C (ASCORBIC ACID) tablet 250 mg  250 mg Oral BID Eduard ClosKakrakandy, Arshad N, MD   250 mg at 01/29/18 [id number]     Discharge Medications: Please see discharge summary for a list of discharge medications.  Relevant Imaging Results:  Relevant Lab Results:   Additional Information ss#[id number].  Cristobal Goldmannrawford, Kensy Blizard Bradley, LCSW

## 2018-01-29 NOTE — Clinical Social Work Note (Signed)
Clinical Social Work Assessment  Patient Details  Name: Steven Chambers MRN: [id number] Date of Birth: 10/07/1945  Date of referral:  01/29/18               Reason for consult:  Facility Placement, Discharge Planning                Permission sought to share information with:  Family Supports Permission granted to share information::  Yes, Verbal Permission Granted  Name::     Steven Chambers and Steven Chambers  Agency::     Relationship::  Daughters  Contact Information:  Bjorn LoserRhonda [phone] and Jasmine DecemberSharon [phone]  Housing/Transportation Living arrangements for the past 2 months:  Skilled Nursing Facility(Patient has been at [facility] since 01/17/18) Source of Information:  Spouse, Adult Children(Wife and daughter Bjorn LoserRhonda at the bedside) Patient Interpreter Needed:  None Criminal Activity/Legal Involvement Pertinent to Current Situation/Hospitalization:  No - Comment as needed Significant Relationships:  Other Family Members, Adult Children, Spouse Lives with:  Spouse(Was living with wife prior to past hospitalization and then d/c to [facility]) Do you feel safe going back to the place where you live?  No(Family and patient in agrement with continued rehab at another venue) Need for family participation in patient care:  Yes (Comment)  Care giving concerns: Patient currently getting [facility] and needs continued rehab, however patient and family have not been pleased with the care and want another facility for patient to continue with rehab.  Social Worker assessment / plan:  CSW talked with patient, wife and daughter Bjorn LoserRhonda at the bedside regarding discharge disposition and family's request for patient to discharge to another facility for continued rehab. Mr. Agustin Creeewkirk was sitting up in a chair and was alert, oriented and participated in the conversation regarding his discharge disposition. Daughter informed CSW that she had already made contact with a facility - [facility]. CSW explained that facility search process, provided family with a SNF list and answered their questions.   CSW advised by Mrs. Barz that her husband is at [facility] [place] because both of her daughters live in [place] and wanted him near to them and she was in agreement with this plan.  Wife in agreement with [place] in [place] and completing his rehab at another facility.  Employment status:  Retired Wellsite geologistnsurance information:  Managed Care, Managed Medicare([employer] and BCBS Other) PT Recommendations:  Skilled Nursing Facility Information / Referral to community resources:  Skilled Nursing Facility(D[facility]  Patient/Family's Response to care:  Patient/family expressed no concerns regarding patient's care during hospitalization.  Patient/Family's Understanding of and Emotional Response to Diagnosis, Current Treatment, and Prognosis:  Patient/family appeared to have an understanding of why patient  continues to need rehab. after discharge.    Emotional Assessment Appearance:  Appears stated age Attitude/Demeanor/Rapport:  Engaged(Appropriate) Affect (typically observed):  Accepting, Appropriate, Pleasant Orientation:  Oriented to Self, Oriented to Place, Oriented to  Time, Oriented to Situation Alcohol / Substance use:  Tobacco Use, Alcohol Use, Illicit Drugs(Patient reported that he qjuit smoking and does not drink or use illicit drugs) Psych involvement (Current and /or in the community):  No (Comment)  Discharge Needs  Concerns to be addressed:  Discharge Planning Concerns Readmission within the last 30 days:  No Current discharge risk:  None Barriers to Discharge:  Continued Medical Work up   [facility], Lazaro ArmsVanessa Bradley, LCSW 01/29/2018, 5:32 PM
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
## 2018-01-30 ENCOUNTER — Inpatient Hospital Stay (HOSPITAL_COMMUNITY): Payer: Medicare Other

## 2018-01-30 LAB — BASIC METABOLIC PANEL
Anion gap: 7 (ref 5–15)
BUN: 55 mg/dL — ABNORMAL HIGH (ref 6–20)
CALCIUM: 9.1 mg/dL (ref 8.9–10.3)
CO2: 19 mmol/L — AB (ref 22–32)
CREATININE: 2.46 mg/dL — AB (ref 0.61–1.24)
Chloride: 114 mmol/L — ABNORMAL HIGH (ref 101–111)
GFR calc Af Amer: 29 mL/min — ABNORMAL LOW (ref >60)
GFR, EST NON AFRICAN AMERICAN: 25 mL/min — AB (ref >60)
GLUCOSE: 140 mg/dL — AB (ref 65–99)
Potassium: 3.9 mmol/L (ref 3.5–5.1)
Sodium: 140 mmol/L (ref 135–145)

## 2018-01-30 LAB — HEMOGLOBIN AND HEMATOCRIT, BLOOD
HCT: 25.5 % — ABNORMAL LOW (ref 39.0–52.0)
Hemoglobin: 8.6 g/dL — ABNORMAL LOW (ref 13.0–17.0)

## 2018-01-30 LAB — GLUCOSE, CAPILLARY
Glucose-Capillary: 160 mg/dL — ABNORMAL HIGH (ref 65–99)
Glucose-Capillary: 186 mg/dL — ABNORMAL HIGH (ref 65–99)
Glucose-Capillary: 187 mg/dL — ABNORMAL HIGH (ref 65–99)

## 2018-01-30 MED ORDER — MUPIROCIN 2 % EX OINT: 1.0000 "application " | TOPICAL_OINTMENT | Freq: Two times a day (BID) | CUTANEOUS | 0 refills | Status: AC

## 2018-01-30 MED ORDER — CEPHALEXIN 250 MG PO CAPS: 250.0000 mg | ORAL_CAPSULE | Freq: Three times a day (TID) | ORAL | 0 refills | Status: AC

## 2018-01-30 MED ORDER — OXYCODONE HCL 5 MG PO TABS: 5.0000 mg | ORAL_TABLET | Freq: Four times a day (QID) | ORAL | 0 refills | Status: AC | PRN
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
## 2018-01-30 NOTE — Discharge Instructions (Signed)
Urinary Tract Infection, Adult  A urinary tract infection (UTI) is an infection of any part of the urinary tract, which includes the kidneys, ureters, bladder, and urethra. These organs make, store, and get rid of urine in the body. UTI can be a bladder infection (cystitis) or kidney infection (pyelonephritis).  What are the causes?  This infection may be caused by fungi, viruses, or bacteria. Bacteria are the most common cause of UTIs. This condition can also be caused by repeated incomplete emptying of the bladder during urination.  What increases the risk?  This condition is more likely to develop if:   You ignore your need to urinate or hold urine for long periods of time.   You do not empty your bladder completely during urination.   You wipe back to front after urinating or having a bowel movement, if you are male.   You are uncircumcised, if you are male.   You are constipated.   You have a urinary catheter that stays in place (indwelling).   You have a weak defense (immune) system.   You have a medical condition that affects your bowels, kidneys, or bladder.   You have diabetes.   You take antibiotic medicines frequently or for long periods of time, and the antibiotics no longer work well against certain types of infections (antibiotic resistance).   You take medicines that irritate your urinary tract.   You are exposed to chemicals that irritate your urinary tract.   You are male.    What are the signs or symptoms?  Symptoms of this condition include:   Fever.   Frequent urination or passing small amounts of urine frequently.   Needing to urinate urgently.   Pain or burning with urination.   Urine that smells bad or unusual.   Cloudy urine.   Pain in the lower abdomen or back.   Trouble urinating.   Blood in the urine.   Vomiting or being less hungry than normal.   Diarrhea or abdominal pain.   Vaginal discharge, if you are male.    How is this diagnosed?  This condition is  diagnosed with a medical history and physical exam. You will also need to provide a urine sample to test your urine. Other tests may be done, including:   Blood tests.   Sexually transmitted disease (STD) testing.    If you have had more than one UTI, a cystoscopy or imaging studies may be done to determine the cause of the infections.  How is this treated?  Treatment for this condition often includes a combination of two or more of the following:   Antibiotic medicine.   Other medicines to treat less common causes of UTI.   Over-the-counter medicines to treat pain.   Drinking enough water to stay hydrated.    Follow these instructions at home:   Take over-the-counter and prescription medicines only as told by your health care provider.   If you were prescribed an antibiotic, take it as told by your health care provider. Do not stop taking the antibiotic even if you start to feel better.   Avoid alcohol, caffeine, tea, and carbonated beverages. They can irritate your bladder.   Drink enough fluid to keep your urine clear or pale yellow.   Keep all follow-up visits as told by your health care provider. This is important.   Make sure to:  ? Empty your bladder often and completely. Do not hold urine for long periods of time.  ?   Empty your bladder before and after sex.  ? Wipe from front to back after a bowel movement if you are male. Use each tissue one time when you wipe.  Contact a health care provider if:   You have back pain.   You have a fever.   You feel nauseous or vomit.   Your symptoms do not get better after 3 days.   Your symptoms go away and then return.  Get help right away if:   You have severe back pain or lower abdominal pain.   You are vomiting and cannot keep down any medicines or water.  This information is not intended to replace advice given to you by your health care provider. Make sure you discuss any questions you have with your health care provider.  Document Released:  05/11/2005 Document Revised: 01/13/2016 Document Reviewed: 06/22/2015  Elsevier Interactive Patient Education  2018 [employer]

## 2018-01-30 NOTE — Clinical Social Work Placement (Deleted)
   CLINICAL SOCIAL WORK PLACEMENT  NOTE  Date:  01/30/2018  Patient Details  Name: Steven Chambers MRN: [id number] Date of Birth: 03/02/1946  Clinical Social Work is seeking post-discharge placement for this patient at the Skilled  Nursing Facility level of care (*CSW will initial, date and re-position this form in  chart as items are completed):  Yes   Patient/family provided with [place] Clinical Social Work Department's list of facilities offering this level of care within the geographic area requested by the patient (or if unable, by the patient's family).  Yes   Patient/family informed of their freedom to choose among providers that offer the needed level of care, that participate in Medicare, Medicaid or managed care program needed by the patient, have an available bed and are willing to accept the patient.  Yes   Patient/family informed of [facility]'s ownership interest [facility] and [facility], as well as of the fact that they are under no obligation to receive care at these facilities.  PASRR submitted to EDS on       PASRR number received on       Existing PASRR number confirmed on 01/29/18     FL2 transmitted to all facilities in geographic area requested by pt/family on 01/29/18     FL2 transmitted to all facilities within larger geographic area on       Patient informed that his/her managed care company has contracts with or will negotiate with certain facilities, including the following:        Yes   Patient/family informed of bed offers received.  Patient chooses bed at [facility]     Physician recommends and patient chooses bed at      Patient to be transferred to [facility] on 01/30/18.  Patient to be transferred to facility by Ambulance     Patient family notified on 01/30/18 of transfer.  Name of family member notified:  [name] [phone]) and Minus BreedingRhonda Boxell [phone]) daughters, by phone      PHYSICIAN       Additional Comment:    _______________________________________________ Cristobal Goldmannrawford, Serafina Topham Bradley, LCSW 01/30/2018, 5:28 PM
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
## 2018-01-30 NOTE — Progress Notes (Signed)
Pt discharging to [facility] today. Attempting to call report to facility receiving nurse at this time. No one picking up for report after call transferred to hall where pt will be on, pt will be in room 509.

## 2018-01-30 NOTE — Progress Notes (Signed)
On second attempt able to give report to nurse Aram Beechamynthia at [facility].  SW arranging transportation.  Vonna KotykJay, RN currently caring for pt informed of report being called.

## 2018-01-30 NOTE — Clinical Social Work Placement (Signed)
   CLINICAL SOCIAL WORK PLACEMENT  NOTE  *01/30/2018 - DISCHARGED TO [facility]-ROOM 509, VIA AMBULANCE    Date:  01/30/2018  Patient Details  Name: Steven Chambers MRN: [id number] Date of Birth: 09/09/1945  Clinical Social Work is seeking post-discharge placement for this patient at the Skilled  Nursing Facility level of care (*CSW will initial, date and re-position this form in  chart as items are completed):  Yes   Patient/family provided with [facility] Clinical Social Work Department's list of facilities offering this level of care within the geographic area requested by the patient (or if unable, by the patient's family).  Yes   Patient/family informed of their freedom to choose among providers that offer the needed level of care, that participate in Medicare, Medicaid or managed care program needed by the patient, have an available bed and are willing to accept the patient.  Yes   Patient/family informed of [facility]'s ownership interest in [facility] and [facility], as well as of the fact that they are under no obligation to receive care at these facilities.  PASRR submitted to EDS on       PASRR number received on       Existing PASRR number confirmed on 01/29/18     FL2 transmitted to all facilities in geographic area requested by pt/family on 01/29/18     FL2 transmitted to all facilities within larger geographic area on       Patient informed that his/her managed care company has contracts with or will negotiate with certain facilities, including the following:        Yes   Patient/family informed of bed offers received.  Patient chooses bed at [facility]     Physician recommends and patient chooses bed at      Patient to be transferred to [facility] on 01/30/18.  Patient to be transferred to facility by Ambulance     Patient family notified on 01/30/18 of transfer.  Name of family member notified:   [name] [phone]) and [name] [phone]) daughters, by phone     PHYSICIAN       Additional Comment:    _______________________________________________ Cristobal Goldmannrawford, [place] Oborn Bradley, LCSW 01/30/2018, 2:25 PM

## 2018-01-31 ENCOUNTER — Non-Acute Institutional Stay (SKILLED_NURSING_FACILITY): Payer: Medicare Other | Admitting: Internal Medicine

## 2018-01-31 ENCOUNTER — Telehealth (INDEPENDENT_AMBULATORY_CARE_PROVIDER_SITE_OTHER): Payer: Self-pay | Admitting: Orthopedic Surgery

## 2018-01-31 ENCOUNTER — Encounter: Payer: Self-pay | Admitting: Internal Medicine

## 2018-01-31 DIAGNOSIS — N184 Chronic kidney disease, stage 4 (severe): Secondary | ICD-10-CM

## 2018-01-31 DIAGNOSIS — Z862 Personal history of diseases of the blood and blood-forming organs and certain disorders involving the immune mechanism: Secondary | ICD-10-CM

## 2018-01-31 DIAGNOSIS — M1A09X0 Idiopathic chronic gout, multiple sites, without tophus (tophi): Secondary | ICD-10-CM

## 2018-01-31 DIAGNOSIS — E1121 Type 2 diabetes mellitus with diabetic nephropathy: Secondary | ICD-10-CM

## 2018-01-31 DIAGNOSIS — D62 Acute posthemorrhagic anemia: Secondary | ICD-10-CM

## 2018-01-31 DIAGNOSIS — N12 Tubulo-interstitial nephritis, not specified as acute or chronic: Secondary | ICD-10-CM | POA: Diagnosis not present

## 2018-01-31 DIAGNOSIS — Z940 Kidney transplant status: Secondary | ICD-10-CM | POA: Diagnosis not present

## 2018-01-31 DIAGNOSIS — I609 Nontraumatic subarachnoid hemorrhage, unspecified: Secondary | ICD-10-CM

## 2018-01-31 DIAGNOSIS — S2249XA Multiple fractures of ribs, unspecified side, initial encounter for closed fracture: Secondary | ICD-10-CM

## 2018-01-31 DIAGNOSIS — S065XAA Traumatic subdural hemorrhage with loss of consciousness status unknown, initial encounter: Secondary | ICD-10-CM

## 2018-01-31 DIAGNOSIS — R310 Gross hematuria: Secondary | ICD-10-CM | POA: Diagnosis not present

## 2018-01-31 DIAGNOSIS — A419 Sepsis, unspecified organism: Secondary | ICD-10-CM

## 2018-01-31 DIAGNOSIS — S2222XS Fracture of body of sternum, sequela: Secondary | ICD-10-CM

## 2018-01-31 DIAGNOSIS — I10 Essential (primary) hypertension: Secondary | ICD-10-CM

## 2018-01-31 DIAGNOSIS — N189 Chronic kidney disease, unspecified: Secondary | ICD-10-CM

## 2018-01-31 DIAGNOSIS — E875 Hyperkalemia: Secondary | ICD-10-CM

## 2018-01-31 DIAGNOSIS — S065X9A Traumatic subdural hemorrhage with loss of consciousness of unspecified duration, initial encounter: Secondary | ICD-10-CM

## 2018-01-31 DIAGNOSIS — N179 Acute kidney failure, unspecified: Secondary | ICD-10-CM | POA: Diagnosis not present

## 2018-01-31 LAB — CULTURE, BLOOD (ROUTINE X 2)
Culture: NO GROWTH
Culture: NO GROWTH
Special Requests: ADEQUATE
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
## 2018-01-31 NOTE — Telephone Encounter (Signed)
Please advise. ED consulted with you in regards to patient.

## 2018-01-31 NOTE — Telephone Encounter (Signed)
Stacy from [facility] called asking for the weight bearing status, orders for sling and range of motion notes to be faxed over to [phone]

## 2018-01-31 NOTE — Telephone Encounter (Signed)
Can address once he is seen.

## 2018-01-31 NOTE — Telephone Encounter (Signed)
This is someone Dr. Ophelia CharterYates was called about while in the ER last week.

## 2018-02-01 ENCOUNTER — Encounter: Payer: Self-pay | Admitting: Internal Medicine

## 2018-02-01 DIAGNOSIS — M109 Gout, unspecified: Secondary | ICD-10-CM | POA: Insufficient documentation

## 2018-02-01 DIAGNOSIS — S065XAA Traumatic subdural hemorrhage with loss of consciousness status unknown, initial encounter: Secondary | ICD-10-CM | POA: Insufficient documentation

## 2018-02-01 DIAGNOSIS — S2220XA Unspecified fracture of sternum, initial encounter for closed fracture: Secondary | ICD-10-CM | POA: Insufficient documentation

## 2018-02-01 DIAGNOSIS — N184 Chronic kidney disease, stage 4 (severe): Secondary | ICD-10-CM | POA: Insufficient documentation

## 2018-02-01 DIAGNOSIS — S2249XA Multiple fractures of ribs, unspecified side, initial encounter for closed fracture: Secondary | ICD-10-CM | POA: Insufficient documentation

## 2018-02-01 DIAGNOSIS — N12 Tubulo-interstitial nephritis, not specified as acute or chronic: Secondary | ICD-10-CM | POA: Insufficient documentation

## 2018-02-01 DIAGNOSIS — S065X9A Traumatic subdural hemorrhage with loss of consciousness of unspecified duration, initial encounter: Secondary | ICD-10-CM | POA: Insufficient documentation

## 2018-02-01 DIAGNOSIS — Z862 Personal history of diseases of the blood and blood-forming organs and certain disorders involving the immune mechanism: Secondary | ICD-10-CM | POA: Insufficient documentation

## 2018-02-01 DIAGNOSIS — R319 Hematuria, unspecified: Secondary | ICD-10-CM | POA: Insufficient documentation

## 2018-02-01 DIAGNOSIS — N189 Chronic kidney disease, unspecified: Secondary | ICD-10-CM

## 2018-02-01 DIAGNOSIS — D62 Acute posthemorrhagic anemia: Secondary | ICD-10-CM | POA: Insufficient documentation

## 2018-02-01 DIAGNOSIS — I609 Nontraumatic subarachnoid hemorrhage, unspecified: Secondary | ICD-10-CM | POA: Insufficient documentation

## 2018-02-02 LAB — COMPLETE METABOLIC PANEL WITH GFR
ALT: 11
AST: 12
Albumin: 3.1
Alkaline Phosphatase: 104
BUN: 38 — AB (ref 4–21)
CALCIUM: 8.9
Creat: 1.92
EGFR (African American): 39.49
GLUCOSE: 198
POTASSIUM: 4.2
SODIUM: 138
Total Bilirubin: 0.4

## 2018-02-02 LAB — CBC
HCT: 26.7
HGB: 8.8
MCV: 72.8
PLATELET COUNT: 89
WBC: 10.5

## 2018-02-02 NOTE — Telephone Encounter (Signed)
Faxed note to attn: Stacy advising. Patient has appt on 6/25 in the office.

## 2018-02-05 ENCOUNTER — Encounter: Payer: Self-pay | Admitting: *Deleted

## 2018-02-06 ENCOUNTER — Ambulatory Visit (INDEPENDENT_AMBULATORY_CARE_PROVIDER_SITE_OTHER): Payer: Self-pay | Admitting: Orthopaedic Surgery

## 2018-02-09 ENCOUNTER — Encounter: Payer: Self-pay | Admitting: Internal Medicine

## 2018-02-09 ENCOUNTER — Non-Acute Institutional Stay (SKILLED_NURSING_FACILITY): Payer: Medicare Other | Admitting: Internal Medicine

## 2018-02-09 DIAGNOSIS — S2222XS Fracture of body of sternum, sequela: Secondary | ICD-10-CM

## 2018-02-09 DIAGNOSIS — E1121 Type 2 diabetes mellitus with diabetic nephropathy: Secondary | ICD-10-CM

## 2018-02-09 DIAGNOSIS — I10 Essential (primary) hypertension: Secondary | ICD-10-CM

## 2018-02-09 DIAGNOSIS — N12 Tubulo-interstitial nephritis, not specified as acute or chronic: Secondary | ICD-10-CM | POA: Diagnosis not present

## 2018-02-09 DIAGNOSIS — S2249XA Multiple fractures of ribs, unspecified side, initial encounter for closed fracture: Secondary | ICD-10-CM

## 2018-02-09 DIAGNOSIS — R310 Gross hematuria: Secondary | ICD-10-CM | POA: Diagnosis not present

## 2018-02-09 DIAGNOSIS — A419 Sepsis, unspecified organism: Secondary | ICD-10-CM

## 2018-02-09 DIAGNOSIS — N3000 Acute cystitis without hematuria: Secondary | ICD-10-CM

## 2018-02-09 DIAGNOSIS — D62 Acute posthemorrhagic anemia: Secondary | ICD-10-CM | POA: Diagnosis not present

## 2018-02-09 DIAGNOSIS — Z940 Kidney transplant status: Secondary | ICD-10-CM

## 2018-02-09 DIAGNOSIS — S065X9A Traumatic subdural hemorrhage with loss of consciousness of unspecified duration, initial encounter: Secondary | ICD-10-CM

## 2018-02-09 DIAGNOSIS — N184 Chronic kidney disease, stage 4 (severe): Secondary | ICD-10-CM

## 2018-02-09 DIAGNOSIS — S065XAA Traumatic subdural hemorrhage with loss of consciousness status unknown, initial encounter: Secondary | ICD-10-CM

## 2018-02-09 DIAGNOSIS — N179 Acute kidney failure, unspecified: Secondary | ICD-10-CM

## 2018-02-09 DIAGNOSIS — I609 Nontraumatic subarachnoid hemorrhage, unspecified: Secondary | ICD-10-CM | POA: Diagnosis not present
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
## 2018-02-12 DIAGNOSIS — N390 Urinary tract infection, site not specified: Secondary | ICD-10-CM

## 2018-02-12 HISTORY — DX: Urinary tract infection, site not specified: N39.0

## 2018-02-14 ENCOUNTER — Inpatient Hospital Stay (HOSPITAL_COMMUNITY)
Admission: EM | Admit: 2018-02-14 | Discharge: 2018-02-17 | DRG: 690 | Disposition: A | Discharge status: Home Health | Payer: Medicare Other | Attending: Internal Medicine | Admitting: Internal Medicine

## 2018-02-14 ENCOUNTER — Other Ambulatory Visit: Payer: Self-pay

## 2018-02-14 ENCOUNTER — Encounter (HOSPITAL_COMMUNITY): Payer: Self-pay

## 2018-02-14 DIAGNOSIS — Z87820 Personal history of traumatic brain injury: Secondary | ICD-10-CM

## 2018-02-14 DIAGNOSIS — I160 Hypertensive urgency: Secondary | ICD-10-CM | POA: Diagnosis present

## 2018-02-14 DIAGNOSIS — Z940 Kidney transplant status: Secondary | ICD-10-CM

## 2018-02-14 DIAGNOSIS — D509 Iron deficiency anemia, unspecified: Secondary | ICD-10-CM | POA: Diagnosis present

## 2018-02-14 DIAGNOSIS — Z7952 Long term (current) use of systemic steroids: Secondary | ICD-10-CM | POA: Diagnosis not present

## 2018-02-14 DIAGNOSIS — N184 Chronic kidney disease, stage 4 (severe): Secondary | ICD-10-CM | POA: Diagnosis present

## 2018-02-14 DIAGNOSIS — S065X9A Traumatic subdural hemorrhage with loss of consciousness of unspecified duration, initial encounter: Secondary | ICD-10-CM | POA: Diagnosis present

## 2018-02-14 DIAGNOSIS — N390 Urinary tract infection, site not specified: Principal | ICD-10-CM | POA: Diagnosis present

## 2018-02-14 DIAGNOSIS — E1121 Type 2 diabetes mellitus with diabetic nephropathy: Secondary | ICD-10-CM | POA: Diagnosis not present

## 2018-02-14 DIAGNOSIS — D696 Thrombocytopenia, unspecified: Secondary | ICD-10-CM | POA: Diagnosis present

## 2018-02-14 DIAGNOSIS — Z79899 Other long term (current) drug therapy: Secondary | ICD-10-CM

## 2018-02-14 DIAGNOSIS — I129 Hypertensive chronic kidney disease with stage 1 through stage 4 chronic kidney disease, or unspecified chronic kidney disease: Secondary | ICD-10-CM | POA: Diagnosis present

## 2018-02-14 DIAGNOSIS — I609 Nontraumatic subarachnoid hemorrhage, unspecified: Secondary | ICD-10-CM

## 2018-02-14 DIAGNOSIS — M109 Gout, unspecified: Secondary | ICD-10-CM | POA: Diagnosis present

## 2018-02-14 DIAGNOSIS — Z794 Long term (current) use of insulin: Secondary | ICD-10-CM | POA: Diagnosis not present

## 2018-02-14 DIAGNOSIS — Z7982 Long term (current) use of aspirin: Secondary | ICD-10-CM

## 2018-02-14 DIAGNOSIS — D631 Anemia in chronic kidney disease: Secondary | ICD-10-CM | POA: Diagnosis present

## 2018-02-14 DIAGNOSIS — D649 Anemia, unspecified: Secondary | ICD-10-CM | POA: Diagnosis not present

## 2018-02-14 DIAGNOSIS — S2249XA Multiple fractures of ribs, unspecified side, initial encounter for closed fracture: Secondary | ICD-10-CM | POA: Diagnosis not present

## 2018-02-14 DIAGNOSIS — Z87891 Personal history of nicotine dependence: Secondary | ICD-10-CM | POA: Diagnosis not present

## 2018-02-14 DIAGNOSIS — E1129 Type 2 diabetes mellitus with other diabetic kidney complication: Secondary | ICD-10-CM | POA: Diagnosis present

## 2018-02-14 DIAGNOSIS — Z91018 Allergy to other foods: Secondary | ICD-10-CM | POA: Diagnosis not present

## 2018-02-14 DIAGNOSIS — N179 Acute kidney failure, unspecified: Secondary | ICD-10-CM

## 2018-02-14 DIAGNOSIS — E1122 Type 2 diabetes mellitus with diabetic chronic kidney disease: Secondary | ICD-10-CM | POA: Diagnosis present

## 2018-02-14 DIAGNOSIS — I10 Essential (primary) hypertension: Secondary | ICD-10-CM | POA: Diagnosis present

## 2018-02-14 DIAGNOSIS — N3000 Acute cystitis without hematuria: Secondary | ICD-10-CM

## 2018-02-14 DIAGNOSIS — Z91013 Allergy to seafood: Secondary | ICD-10-CM | POA: Diagnosis not present

## 2018-02-14 DIAGNOSIS — S065XAA Traumatic subdural hemorrhage with loss of consciousness status unknown, initial encounter: Secondary | ICD-10-CM | POA: Diagnosis present

## 2018-02-14 HISTORY — DX: Urinary tract infection, site not specified: N39.0

## 2018-02-14 LAB — CBC WITH DIFFERENTIAL/PLATELET
BASOS PCT: 0 %
Basophils Absolute: 0 10*3/uL (ref 0.0–0.1)
EOS PCT: 1 %
Eosinophils Absolute: 0.1 10*3/uL (ref 0.0–0.7)
HEMATOCRIT: 23.4 % — AB (ref 39.0–52.0)
HEMOGLOBIN: 7.6 g/dL — AB (ref 13.0–17.0)
LYMPHS ABS: 1.1 10*3/uL (ref 0.7–4.0)
LYMPHS PCT: 14 %
MCH: 23.6 pg — ABNORMAL LOW (ref 26.0–34.0)
MCHC: 32.5 g/dL (ref 30.0–36.0)
MCV: 72.7 fL — AB (ref 78.0–100.0)
MONOS PCT: 8 %
Monocytes Absolute: 0.6 10*3/uL (ref 0.1–1.0)
NEUTROS ABS: 5.7 10*3/uL (ref 1.7–7.7)
Neutrophils Relative %: 77 %
Platelets: 105 10*3/uL — ABNORMAL LOW (ref 150–400)
RBC: 3.22 MIL/uL — ABNORMAL LOW (ref 4.22–5.81)
RDW: 23.4 % — AB (ref 11.5–15.5)
WBC: 7.5 10*3/uL (ref 4.0–10.5)

## 2018-02-14 LAB — BASIC METABOLIC PANEL
Anion gap: 6 (ref 5–15)
BUN: 28 mg/dL — ABNORMAL HIGH (ref 8–23)
CALCIUM: 9 mg/dL (ref 8.9–10.3)
CHLORIDE: 114 mmol/L — AB (ref 98–111)
CO2: 18 mmol/L — AB (ref 22–32)
CREATININE: 2.39 mg/dL — AB (ref 0.61–1.24)
GFR calc non Af Amer: 26 mL/min — ABNORMAL LOW (ref >60)
GFR, EST AFRICAN AMERICAN: 30 mL/min — AB (ref >60)
GLUCOSE: 184 mg/dL — AB (ref 70–99)
Potassium: 5.5 mmol/L — ABNORMAL HIGH (ref 3.5–5.1)
Sodium: 138 mmol/L (ref 135–145)

## 2018-02-14 LAB — URINALYSIS, ROUTINE W REFLEX MICROSCOPIC
Bilirubin Urine: NEGATIVE
Glucose, UA: NEGATIVE mg/dL
Ketones, ur: NEGATIVE mg/dL
NITRITE: NEGATIVE
PH: 5 (ref 5.0–8.0)
Protein, ur: 100 mg/dL — AB
SPECIFIC GRAVITY, URINE: 1.013 (ref 1.005–1.030)
WBC, UA: >50 WBC/hpf — ABNORMAL HIGH (ref 0–5)

## 2018-02-14 LAB — GLUCOSE, CAPILLARY: GLUCOSE-CAPILLARY: 144 mg/dL — AB (ref 70–99)

## 2018-02-14 LAB — LACTIC ACID, PLASMA: Lactic Acid, Venous: 0.8 mmol/L (ref 0.5–1.9)

## 2018-02-14 MED ORDER — SODIUM CHLORIDE 0.9 % IV SOLN
1.0000 g | Freq: Once | INTRAVENOUS | Status: AC
  Administered 2018-02-14: 1 g via INTRAVENOUS
  Filled 2018-02-14: qty 10

## 2018-02-14 MED ORDER — ONDANSETRON HCL 4 MG/2ML IJ SOLN
4.0000 mg | Freq: Four times a day (QID) | INTRAMUSCULAR | Status: DC | PRN
  Administered 2018-02-15: 4 mg via INTRAVENOUS
  Filled 2018-02-14 (×2): qty 2

## 2018-02-14 MED ORDER — OXYCODONE HCL 5 MG PO TABS
5.0000 mg | ORAL_TABLET | Freq: Four times a day (QID) | ORAL | Status: DC | PRN
  Administered 2018-02-15 – 2018-02-16 (×3): 5 mg via ORAL
  Filled 2018-02-14 (×3): qty 1

## 2018-02-14 MED ORDER — ASPIRIN 81 MG PO CHEW
81.0000 mg | CHEWABLE_TABLET | Freq: Every day | ORAL | Status: DC
  Administered 2018-02-15 – 2018-02-17 (×3): 81 mg via ORAL
  Filled 2018-02-14 (×3): qty 1

## 2018-02-14 MED ORDER — ACETAMINOPHEN 325 MG PO TABS: 650.0000 mg | ORAL_TABLET | Freq: Four times a day (QID) | ORAL | Status: DC | PRN

## 2018-02-14 MED ORDER — HYDRALAZINE HCL 25 MG PO TABS
25.0000 mg | ORAL_TABLET | Freq: Three times a day (TID) | ORAL | Status: DC
  Administered 2018-02-15 – 2018-02-17 (×8): 25 mg via ORAL
  Filled 2018-02-14 (×8): qty 1

## 2018-02-14 MED ORDER — METHOCARBAMOL 500 MG PO TABS
500.0000 mg | ORAL_TABLET | Freq: Two times a day (BID) | ORAL | Status: DC
  Administered 2018-02-15 – 2018-02-17 (×5): 500 mg via ORAL
  Filled 2018-02-14 (×5): qty 1

## 2018-02-14 MED ORDER — SODIUM CHLORIDE 0.9 % IV SOLN
1.0000 g | INTRAVENOUS | Status: DC
  Administered 2018-02-15 – 2018-02-16 (×2): 1 g via INTRAVENOUS
  Filled 2018-02-14 (×2): qty 10

## 2018-02-14 MED ORDER — INSULIN ASPART 100 UNIT/ML ~~LOC~~ SOLN
0.0000 [IU] | Freq: Three times a day (TID) | SUBCUTANEOUS | Status: DC
  Administered 2018-02-15: 3 [IU] via SUBCUTANEOUS
  Administered 2018-02-15: 2 [IU] via SUBCUTANEOUS
  Administered 2018-02-15: 3 [IU] via SUBCUTANEOUS
  Administered 2018-02-16 (×2): 2 [IU] via SUBCUTANEOUS
  Administered 2018-02-17: 1 [IU] via SUBCUTANEOUS

## 2018-02-14 MED ORDER — SENNA 8.6 MG PO TABS
1.0000 | ORAL_TABLET | Freq: Every day | ORAL | Status: DC
  Administered 2018-02-15 – 2018-02-17 (×3): 8.6 mg via ORAL
  Filled 2018-02-14 (×3): qty 1

## 2018-02-14 MED ORDER — DILTIAZEM HCL ER COATED BEADS 240 MG PO CP24
240.0000 mg | ORAL_CAPSULE | Freq: Two times a day (BID) | ORAL | Status: DC
  Administered 2018-02-15 – 2018-02-17 (×6): 240 mg via ORAL
  Filled 2018-02-14 (×6): qty 1

## 2018-02-14 MED ORDER — BISACODYL 10 MG RE SUPP: 10.0000 mg | RECTAL | Status: DC | PRN

## 2018-02-14 MED ORDER — TACROLIMUS 1 MG PO CAPS
5.0000 mg | ORAL_CAPSULE | Freq: Two times a day (BID) | ORAL | Status: DC
  Administered 2018-02-15 (×3): 5 mg via ORAL
  Filled 2018-02-14 (×3): qty 5

## 2018-02-14 MED ORDER — MYCOPHENOLATE MOFETIL 250 MG PO CAPS
500.0000 mg | ORAL_CAPSULE | Freq: Two times a day (BID) | ORAL | Status: DC
  Administered 2018-02-15 – 2018-02-17 (×6): 500 mg via ORAL
  Filled 2018-02-14 (×6): qty 2

## 2018-02-14 MED ORDER — PANTOPRAZOLE SODIUM 40 MG PO TBEC
40.0000 mg | DELAYED_RELEASE_TABLET | Freq: Every day | ORAL | Status: DC
  Administered 2018-02-15 – 2018-02-17 (×3): 40 mg via ORAL
  Filled 2018-02-14 (×3): qty 1

## 2018-02-14 MED ORDER — SODIUM CHLORIDE 0.9 % IV SOLN
INTRAVENOUS | Status: DC
  Administered 2018-02-15: 12:00:00 via INTRAVENOUS
  Administered 2018-02-15: 100 mL/h via INTRAVENOUS

## 2018-02-14 MED ORDER — HYDRALAZINE HCL 20 MG/ML IJ SOLN
10.0000 mg | INTRAMUSCULAR | Status: DC | PRN
  Filled 2018-02-14: qty 1

## 2018-02-14 MED ORDER — MAGNESIUM OXIDE 400 (241.3 MG) MG PO TABS
400.0000 mg | ORAL_TABLET | Freq: Two times a day (BID) | ORAL | Status: DC
  Administered 2018-02-15 – 2018-02-17 (×6): 400 mg via ORAL
  Filled 2018-02-14 (×6): qty 1

## 2018-02-14 MED ORDER — ACETAMINOPHEN 650 MG RE SUPP: 650.0000 mg | Freq: Four times a day (QID) | RECTAL | Status: DC | PRN

## 2018-02-14 MED ORDER — FERROUS SULFATE 325 (65 FE) MG PO TABS
325.0000 mg | ORAL_TABLET | Freq: Two times a day (BID) | ORAL | Status: DC
  Administered 2018-02-15: 325 mg via ORAL
  Filled 2018-02-14 (×3): qty 1

## 2018-02-14 MED ORDER — DOCUSATE SODIUM 100 MG PO CAPS
100.0000 mg | ORAL_CAPSULE | Freq: Two times a day (BID) | ORAL | Status: DC
  Administered 2018-02-15 – 2018-02-17 (×6): 100 mg via ORAL
  Filled 2018-02-14 (×6): qty 1

## 2018-02-14 MED ORDER — ONDANSETRON HCL 4 MG PO TABS: 4.0000 mg | ORAL_TABLET | Freq: Three times a day (TID) | ORAL | Status: DC | PRN

## 2018-02-14 MED ORDER — FEBUXOSTAT 40 MG PO TABS
40.0000 mg | ORAL_TABLET | Freq: Every day | ORAL | Status: DC
  Administered 2018-02-15 – 2018-02-16 (×2): 40 mg via ORAL
  Filled 2018-02-14 (×3): qty 1

## 2018-02-14 MED ORDER — SODIUM CHLORIDE 0.9 % IV BOLUS
1000.0000 mL | Freq: Once | INTRAVENOUS | Status: AC
  Administered 2018-02-14: 1000 mL via INTRAVENOUS

## 2018-02-14 MED ORDER — LABETALOL HCL 200 MG PO TABS
300.0000 mg | ORAL_TABLET | Freq: Two times a day (BID) | ORAL | Status: DC
  Administered 2018-02-15 – 2018-02-17 (×6): 300 mg via ORAL
  Filled 2018-02-14 (×6): qty 1

## 2018-02-14 MED ORDER — HYDROCORTISONE NA SUCCINATE PF 100 MG IJ SOLR
50.0000 mg | Freq: Three times a day (TID) | INTRAMUSCULAR | Status: DC
  Administered 2018-02-15 (×2): 50 mg via INTRAVENOUS
  Filled 2018-02-14 (×3): qty 2

## 2018-02-14 MED ORDER — MONTELUKAST SODIUM 10 MG PO TABS
10.0000 mg | ORAL_TABLET | Freq: Every day | ORAL | Status: DC
  Administered 2018-02-15 – 2018-02-16 (×3): 10 mg via ORAL
  Filled 2018-02-14 (×3): qty 1

## 2018-02-14 MED ORDER — INSULIN DETEMIR 100 UNIT/ML ~~LOC~~ SOLN
20.0000 [IU] | Freq: Every day | SUBCUTANEOUS | Status: DC
  Administered 2018-02-15 – 2018-02-17 (×3): 20 [IU] via SUBCUTANEOUS
  Filled 2018-02-14 (×3): qty 0.2

## 2018-02-14 MED ORDER — HEPARIN SODIUM (PORCINE) 5000 UNIT/ML IJ SOLN
5000.0000 [IU] | Freq: Three times a day (TID) | INTRAMUSCULAR | Status: DC
  Administered 2018-02-15 – 2018-02-16 (×5): 5000 [IU] via SUBCUTANEOUS
  Filled 2018-02-14 (×8): qty 1

## 2018-02-14 MED ORDER — VITAMIN C 500 MG PO TABS
250.0000 mg | ORAL_TABLET | Freq: Two times a day (BID) | ORAL | Status: DC
  Administered 2018-02-15 – 2018-02-16 (×3): 250 mg via ORAL
  Filled 2018-02-14 (×4): qty 1

## 2018-02-14 MED ORDER — ONDANSETRON HCL 4 MG PO TABS
4.0000 mg | ORAL_TABLET | Freq: Four times a day (QID) | ORAL | Status: DC | PRN
  Administered 2018-02-15 – 2018-02-17 (×5): 4 mg via ORAL
  Filled 2018-02-14 (×5): qty 1
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
## 2018-02-14 NOTE — ED Triage Notes (Signed)
Patient recently discharged from hospital following week long stay for UTI. Has finished all tx and this am developed dysuria and frequency. Alert and oriented, NAD

## 2018-02-15 ENCOUNTER — Inpatient Hospital Stay (HOSPITAL_COMMUNITY): Payer: Medicare Other

## 2018-02-15 ENCOUNTER — Encounter (HOSPITAL_COMMUNITY): Payer: Self-pay | Admitting: General Practice

## 2018-02-15 DIAGNOSIS — Z940 Kidney transplant status: Secondary | ICD-10-CM

## 2018-02-15 DIAGNOSIS — I10 Essential (primary) hypertension: Secondary | ICD-10-CM

## 2018-02-15 DIAGNOSIS — N179 Acute kidney failure, unspecified: Secondary | ICD-10-CM

## 2018-02-15 DIAGNOSIS — S2249XA Multiple fractures of ribs, unspecified side, initial encounter for closed fracture: Secondary | ICD-10-CM

## 2018-02-15 DIAGNOSIS — N390 Urinary tract infection, site not specified: Principal | ICD-10-CM

## 2018-02-15 LAB — GLUCOSE, CAPILLARY
Glucose-Capillary: 218 mg/dL — ABNORMAL HIGH (ref 70–99)
Glucose-Capillary: 224 mg/dL — ABNORMAL HIGH (ref 70–99)
Glucose-Capillary: 167 mg/dL — ABNORMAL HIGH (ref 70–99)
Glucose-Capillary: 264 mg/dL — ABNORMAL HIGH (ref 70–99)

## 2018-02-15 LAB — BASIC METABOLIC PANEL
Anion gap: 6 (ref 5–15)
BUN: 25 mg/dL — ABNORMAL HIGH (ref 8–23)
CHLORIDE: 114 mmol/L — AB (ref 98–111)
CO2: 21 mmol/L — ABNORMAL LOW (ref 22–32)
Calcium: 8.8 mg/dL — ABNORMAL LOW (ref 8.9–10.3)
Creatinine, Ser: 2.14 mg/dL — ABNORMAL HIGH (ref 0.61–1.24)
GFR calc non Af Amer: 29 mL/min — ABNORMAL LOW (ref >60)
GFR, EST AFRICAN AMERICAN: 34 mL/min — AB (ref >60)
Glucose, Bld: 218 mg/dL — ABNORMAL HIGH (ref 70–99)
POTASSIUM: 5.7 mmol/L — AB (ref 3.5–5.1)
SODIUM: 141 mmol/L (ref 135–145)

## 2018-02-15 LAB — CBC
HEMATOCRIT: 21.7 % — AB (ref 39.0–52.0)
Hemoglobin: 7 g/dL — ABNORMAL LOW (ref 13.0–17.0)
MCH: 23 pg — ABNORMAL LOW (ref 26.0–34.0)
MCHC: 32.3 g/dL (ref 30.0–36.0)
MCV: 71.4 fL — AB (ref 78.0–100.0)
Platelets: 96 10*3/uL — ABNORMAL LOW (ref 150–400)
RBC: 3.04 MIL/uL — AB (ref 4.22–5.81)
RDW: 22.9 % — ABNORMAL HIGH (ref 11.5–15.5)
WBC: 9.2 10*3/uL (ref 4.0–10.5)

## 2018-02-15 MED ORDER — PREDNISONE 5 MG PO TABS
5.0000 mg | ORAL_TABLET | Freq: Every day | ORAL | Status: DC
  Administered 2018-02-16 – 2018-02-17 (×2): 5 mg via ORAL
  Filled 2018-02-15 (×2): qty 1

## 2018-02-15 MED ORDER — SODIUM CHLORIDE 0.45 % IV SOLN
INTRAVENOUS | Status: DC
Start: 2018-02-15 — End: 2018-02-16
  Administered 2018-02-15: 17:00:00 via INTRAVENOUS

## 2018-02-15 NOTE — Progress Notes (Signed)
Pt awake and offers no c/o. Meds taken without difficulty.  No distress noted. Call bell at side.
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
## 2018-02-16 DIAGNOSIS — D649 Anemia, unspecified: Secondary | ICD-10-CM

## 2018-02-16 DIAGNOSIS — D509 Iron deficiency anemia, unspecified: Secondary | ICD-10-CM

## 2018-02-16 LAB — CBC WITH DIFFERENTIAL/PLATELET
BASOS PCT: 0 %
Basophils Absolute: 0 10*3/uL (ref 0.0–0.1)
EOS PCT: 2 %
Eosinophils Absolute: 0.1 10*3/uL (ref 0.0–0.7)
HCT: 20.1 % — ABNORMAL LOW (ref 39.0–52.0)
HEMOGLOBIN: 6.5 g/dL — AB (ref 13.0–17.0)
Lymphocytes Relative: 23 %
Lymphs Abs: 1.6 10*3/uL (ref 0.7–4.0)
MCH: 23 pg — AB (ref 26.0–34.0)
MCHC: 32.3 g/dL (ref 30.0–36.0)
MCV: 71.3 fL — AB (ref 78.0–100.0)
Monocytes Absolute: 0.6 10*3/uL (ref 0.1–1.0)
Monocytes Relative: 9 %
NEUTROS ABS: 4.5 10*3/uL (ref 1.7–7.7)
NEUTROS PCT: 66 %
Platelets: 100 10*3/uL — ABNORMAL LOW (ref 150–400)
RBC: 2.82 MIL/uL — ABNORMAL LOW (ref 4.22–5.81)
RDW: 22.9 % — ABNORMAL HIGH (ref 11.5–15.5)
WBC: 6.8 10*3/uL (ref 4.0–10.5)

## 2018-02-16 LAB — BASIC METABOLIC PANEL
Anion gap: 3 — ABNORMAL LOW (ref 5–15)
BUN: 28 mg/dL — AB (ref 8–23)
CHLORIDE: 114 mmol/L — AB (ref 98–111)
CO2: 21 mmol/L — ABNORMAL LOW (ref 22–32)
Calcium: 8.5 mg/dL — ABNORMAL LOW (ref 8.9–10.3)
Creatinine, Ser: 2.42 mg/dL — ABNORMAL HIGH (ref 0.61–1.24)
GFR calc Af Amer: 29 mL/min — ABNORMAL LOW (ref >60)
GFR calc non Af Amer: 25 mL/min — ABNORMAL LOW (ref >60)
GLUCOSE: 215 mg/dL — AB (ref 70–99)
Potassium: 4.5 mmol/L (ref 3.5–5.1)
Sodium: 138 mmol/L (ref 135–145)

## 2018-02-16 LAB — GLUCOSE, CAPILLARY
GLUCOSE-CAPILLARY: 154 mg/dL — AB (ref 70–99)
GLUCOSE-CAPILLARY: 190 mg/dL — AB (ref 70–99)
Glucose-Capillary: 171 mg/dL — ABNORMAL HIGH (ref 70–99)
Glucose-Capillary: 145 mg/dL — ABNORMAL HIGH (ref 70–99)

## 2018-02-16 LAB — PREPARE RBC (CROSSMATCH)

## 2018-02-16 LAB — HEMOGLOBIN AND HEMATOCRIT, BLOOD
HCT: 25.9 % — ABNORMAL LOW (ref 39.0–52.0)
Hemoglobin: 8.6 g/dL — ABNORMAL LOW (ref 13.0–17.0)

## 2018-02-16 MED ORDER — TACROLIMUS 1 MG PO CAPS
10.0000 mg | ORAL_CAPSULE | Freq: Two times a day (BID) | ORAL | Status: DC
  Administered 2018-02-16: 10 mg via ORAL
  Filled 2018-02-16: qty 10

## 2018-02-16 MED ORDER — SODIUM CHLORIDE 0.9 % IV SOLN
510.0000 mg | Freq: Once | INTRAVENOUS | Status: AC
  Administered 2018-02-16: 510 mg via INTRAVENOUS
  Filled 2018-02-16: qty 17

## 2018-02-16 MED ORDER — TACROLIMUS 5 MG PO CAPS
10.0000 mg | ORAL_CAPSULE | Freq: Two times a day (BID) | ORAL | Status: DC
  Administered 2018-02-16 – 2018-02-17 (×2): 10 mg via ORAL
  Filled 2018-02-16 (×2): qty 2

## 2018-02-16 MED ORDER — SODIUM CHLORIDE 0.9% IV SOLUTION: Freq: Once | INTRAVENOUS | Status: DC

## 2018-02-16 NOTE — Progress Notes (Signed)
CRITICAL VALUE ALERT  Critical Value:  Hgb 6.5  Date & Time Notied:  02/16/18 [id number]  Provider Notified: On call provider via Amion  Orders Received/Actions taken: Waiting for response from on-call provider

## 2018-02-16 NOTE — Progress Notes (Signed)
PT Cancellation Note  Patient Details Name: Steven Chambers MRN: [id number] DOB: 04/07/1946   Cancelled Treatment:    Reason Eval/Treat Not Completed: Other (comment); patient observed in room walking unaided from bathroom with RN in room to start blood transfusion.  Noted pt independent, but wife in room states they were setting up HHPT/OT and speech from MD office but he hasn't seen MD recently and had appointment on 7/1 but was admitted.  She states he needed PT for balance issues.  Will attempt another day, due to RN to start transfusion, to check balance and [facility].    Elray McgregorCynthia Wynn 02/16/2018, 2:22 PM  Sheran Lawlessyndi Wynn, [place] CarolinaPT [phone] 02/16/2018
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
## 2018-02-17 DIAGNOSIS — E1121 Type 2 diabetes mellitus with diabetic nephropathy: Secondary | ICD-10-CM

## 2018-02-17 LAB — CBC WITH DIFFERENTIAL/PLATELET
BASOS ABS: 0 10*3/uL (ref 0.0–0.1)
Basophils Relative: 0 %
EOS ABS: 0.2 10*3/uL (ref 0.0–0.7)
Eosinophils Relative: 3 %
HCT: 26.6 % — ABNORMAL LOW (ref 39.0–52.0)
HEMOGLOBIN: 8.9 g/dL — AB (ref 13.0–17.0)
LYMPHS PCT: 21 %
Lymphs Abs: 1.4 10*3/uL (ref 0.7–4.0)
MCH: 25.4 pg — ABNORMAL LOW (ref 26.0–34.0)
MCHC: 33.5 g/dL (ref 30.0–36.0)
MCV: 75.8 fL — ABNORMAL LOW (ref 78.0–100.0)
Monocytes Absolute: 0.7 10*3/uL (ref 0.1–1.0)
Monocytes Relative: 10 %
NEUTROS ABS: 4.5 10*3/uL (ref 1.7–7.7)
NEUTROS PCT: 66 %
Platelets: 84 10*3/uL — ABNORMAL LOW (ref 150–400)
RBC: 3.51 MIL/uL — AB (ref 4.22–5.81)
RDW: 23.6 % — ABNORMAL HIGH (ref 11.5–15.5)
WBC: 6.8 10*3/uL (ref 4.0–10.5)

## 2018-02-17 LAB — BASIC METABOLIC PANEL
ANION GAP: 3 — AB (ref 5–15)
BUN: 22 mg/dL (ref 8–23)
CALCIUM: 8.8 mg/dL — AB (ref 8.9–10.3)
CHLORIDE: 116 mmol/L — AB (ref 98–111)
CO2: 22 mmol/L (ref 22–32)
CREATININE: 2.22 mg/dL — AB (ref 0.61–1.24)
GFR calc non Af Amer: 28 mL/min — ABNORMAL LOW (ref >60)
GFR, EST AFRICAN AMERICAN: 33 mL/min — AB (ref >60)
Glucose, Bld: 125 mg/dL — ABNORMAL HIGH (ref 70–99)
Potassium: 4.5 mmol/L (ref 3.5–5.1)
SODIUM: 141 mmol/L (ref 135–145)

## 2018-02-17 LAB — TYPE AND SCREEN
ABO/RH(D): B POS
Antibody Screen: NEGATIVE
Unit division: 0
Unit division: 0

## 2018-02-17 LAB — BPAM RBC
Blood Product Expiration Date: 201907222359
Blood Product Expiration Date: 201907222359
ISSUE DATE / TIME: 201907050908
ISSUE DATE / TIME: 201907051411
Unit Type and Rh: 7300
Unit Type and Rh: 7300

## 2018-02-17 LAB — URINE CULTURE

## 2018-02-17 LAB — GLUCOSE, CAPILLARY
GLUCOSE-CAPILLARY: 133 mg/dL — AB (ref 70–99)
GLUCOSE-CAPILLARY: 179 mg/dL — AB (ref 70–99)

## 2018-02-17 MED ORDER — CIPROFLOXACIN HCL 500 MG PO TABS: 500.0000 mg | ORAL_TABLET | Freq: Every day | ORAL | 0 refills | Status: AC

## 2018-02-17 MED ORDER — CIPROFLOXACIN IN D5W 400 MG/200ML IV SOLN: 400.0000 mg | INTRAVENOUS | Status: DC

## 2018-02-17 MED ORDER — CIPROFLOXACIN HCL 500 MG PO TABS
500.0000 mg | ORAL_TABLET | Freq: Every day | ORAL | Status: DC
  Administered 2018-02-17: 500 mg via ORAL
  Filled 2018-02-17: qty 1

## 2018-02-17 MED ORDER — CIPROFLOXACIN HCL 500 MG PO TABS: 500.0000 mg | ORAL_TABLET | Freq: Every day | ORAL | Status: DC

## 2018-02-17 MED ORDER — BLOOD GLUCOSE METER KIT: PACK | 0 refills | Status: AC
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
## 2018-02-17 NOTE — Progress Notes (Signed)
Pharmacy Antibiotic Note  Steven Chambers is a 72 y.o. male admitted on 02/14/2018 with UTI.  Pharmacy has been consulted for ciprofloxacin dosing. Patient presented on 7/3 with dysuria, fevers, chills. Initiated on 7/3 ceftriaxone 1g IV q24h (last dose 7/6). On 7/6 urine culture positive for E. Coli. Pharmacy asked to narrow therapy to ciprofloxacin.  Plan: Initiate Ciprofloxacin 400 mg IV q24h starting 7/6 at 1000 Continue monitoring patient's renal function   Height: 5\' 9"  (175.3 cm) Weight: 174 lb 12.8 oz (79.3 kg)(scale c) IBW/kg (Calculated) : 70.7  Temp (24hrs), Avg:98.7 F (37.1 C), Min:98.2 F (36.8 C), Max:99.4 F (37.4 C)  Recent Labs  Lab 02/14/18 1932 02/15/18 0552 02/16/18 0459 02/17/18 0540  WBC 7.5 9.2 6.8 6.8  CREATININE 2.39* 2.14* 2.42* 2.22*  LATICACIDVEN 0.8  --   --   --     Estimated Creatinine Clearance: 30.5 mL/min (A) (by C-G formula based on SCr of 2.22 mg/dL (H)).    Allergies  Allergen Reactions  . Shellfish Allergy Anaphylaxis    shellfish   . Grapefruit Extract Other (See Comments)    Per kidney transplant team Per kidney transplant team     Antimicrobials this admission: Ceftriaxone 1g q24h 7/3 >> 7/6 Ciprofloxacin 400 mg IV q24h 7/6 >>  Dose adjustments this admission:   Microbiology results:  6/16 BCx: neg 7/6 UCx: E. coli  6/15 MRSA PCR: pos  Thank you for allowing pharmacy to be a part of this patient's care.  Otis PeakRebecca M Lavra Imler 02/17/2018 9:32 AM
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
## 2018-02-17 NOTE — Progress Notes (Signed)
Patient ready for discharge to home. Wife will provide transportation. All discharge information reviewed with patient and family. Medications, follow up appts, rx pick up, activity instructions. All personal belongings with patient.   Patient's personal supply of medication returned to him from pharmacy. No distress noted.
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
## 2018-02-19 DIAGNOSIS — N12 Tubulo-interstitial nephritis, not specified as acute or chronic: Secondary | ICD-10-CM | POA: Diagnosis not present

## 2018-02-19 DIAGNOSIS — S22059D Unspecified fracture of T5-T6 vertebra, subsequent encounter for fracture with routine healing: Secondary | ICD-10-CM

## 2018-02-19 DIAGNOSIS — S22069D Unspecified fracture of T7-T8 vertebra, subsequent encounter for fracture with routine healing: Secondary | ICD-10-CM

## 2018-02-19 DIAGNOSIS — A419 Sepsis, unspecified organism: Secondary | ICD-10-CM

## 2018-02-19 DIAGNOSIS — S22019D Unspecified fracture of first thoracic vertebra, subsequent encounter for fracture with routine healing: Secondary | ICD-10-CM

## 2018-02-19 DIAGNOSIS — S42032D Displaced fracture of lateral end of left clavicle, subsequent encounter for fracture with routine healing: Secondary | ICD-10-CM

## 2018-02-19 DIAGNOSIS — S2220XD Unspecified fracture of sternum, subsequent encounter for fracture with routine healing: Secondary | ICD-10-CM

## 2018-02-19 DIAGNOSIS — S22049D Unspecified fracture of fourth thoracic vertebra, subsequent encounter for fracture with routine healing: Secondary | ICD-10-CM | POA: Diagnosis not present

## 2018-02-20 ENCOUNTER — Ambulatory Visit (INDEPENDENT_AMBULATORY_CARE_PROVIDER_SITE_OTHER): Payer: Self-pay | Admitting: Orthopaedic Surgery

## 2018-03-11 ENCOUNTER — Other Ambulatory Visit: Payer: Self-pay | Admitting: Internal Medicine

## 2018-08-15 DEATH — deceased

## 2019-08-17 IMAGING — CT CT ABD-PELV W/O CM
2 of 4 series · 15 of 46 positions shown, 17 images · non-contrast
Comparison: None.

CLINICAL DATA: Nausea and vomiting.  Right flank pain.

EXAM:
CT ABDOMEN AND PELVIS WITHOUT CONTRAST
TECHNIQUE: Multidetector CT imaging of the abdomen and pelvis was performed
following the standard protocol without IV contrast.

[Series 3: a/p w/o 5mm · axial · non-contrast · 0.77mm/px · z∈[+744,+1159]mm · 12 of 91 slices shown, 14 images]
[im 4/91  soft-tissue]
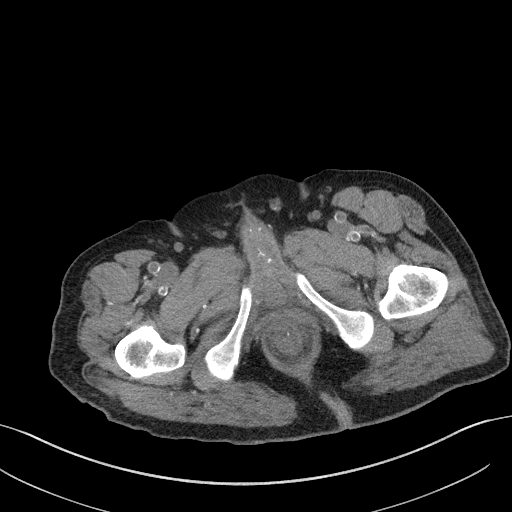
[im 4/91  bone]
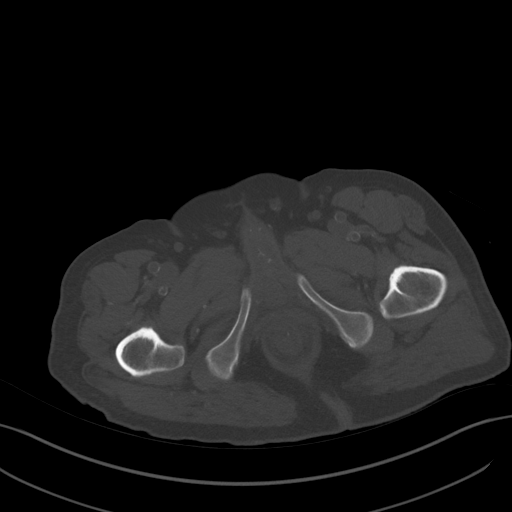
[im 12/91  soft-tissue]
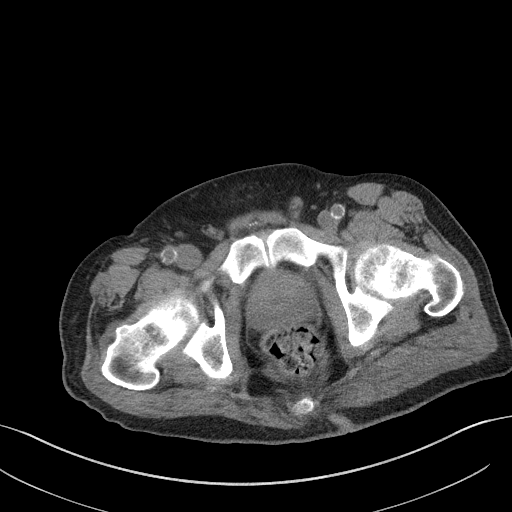
[im 20/91  soft-tissue]
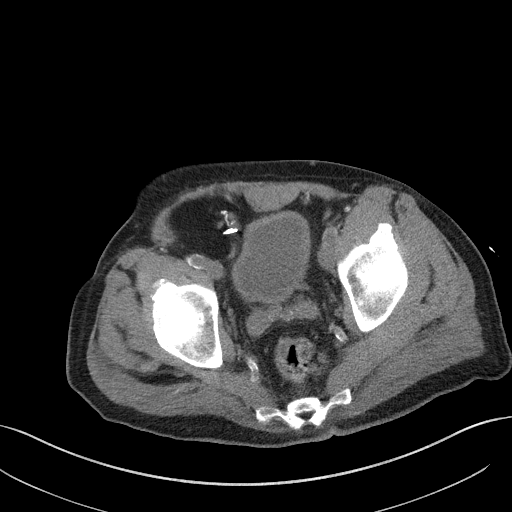
[im 28/91  soft-tissue]
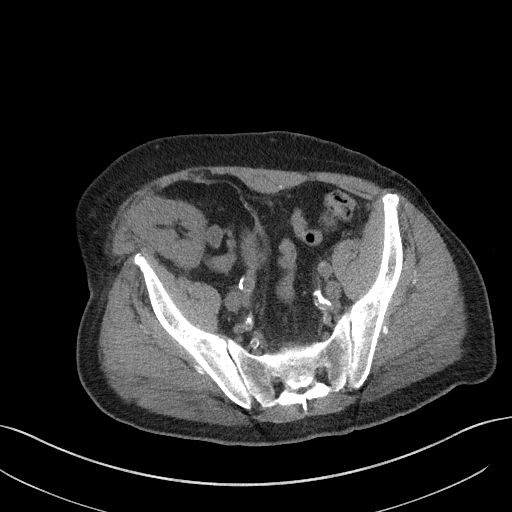
[im 36/91  soft-tissue]
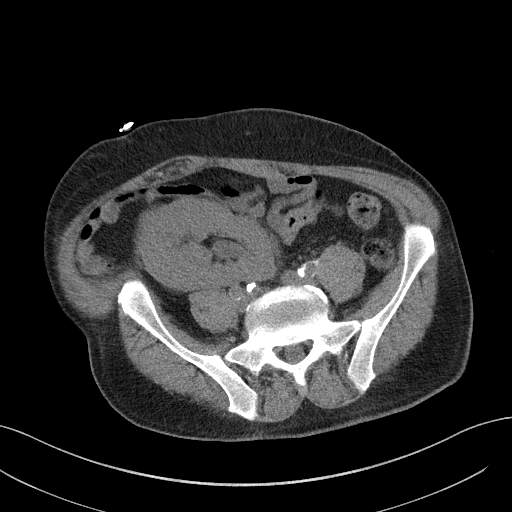
[im 44/91  soft-tissue]
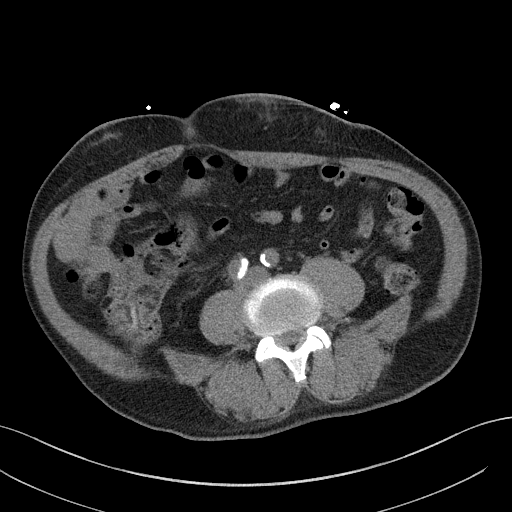
[im 47/91  soft-tissue]
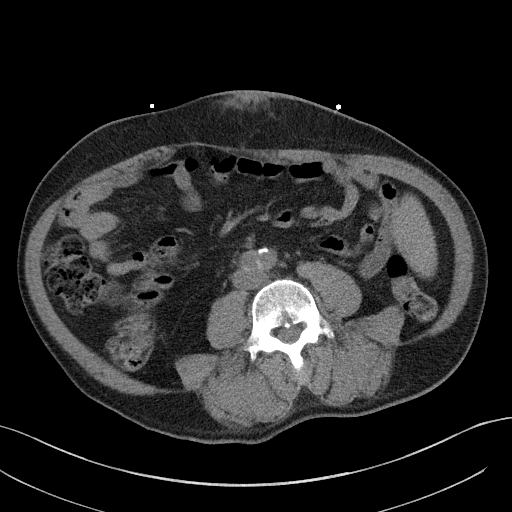
[im 55/91  soft-tissue]
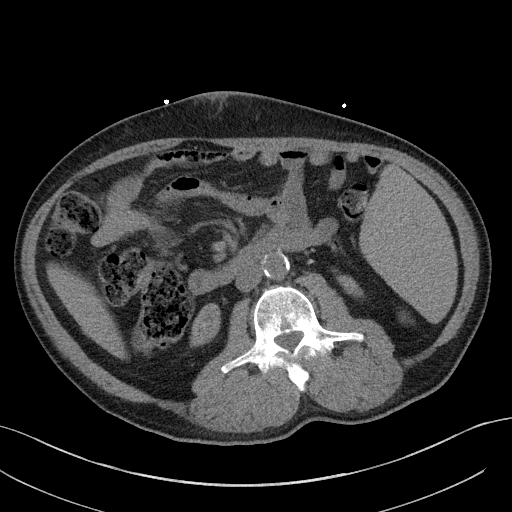
[im 63/91  soft-tissue]
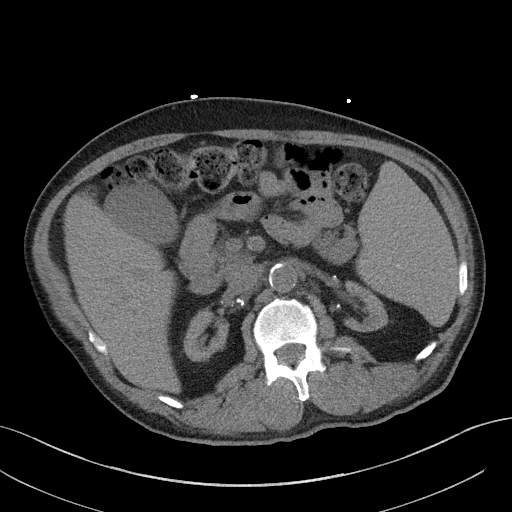
[im 63/91  bone]
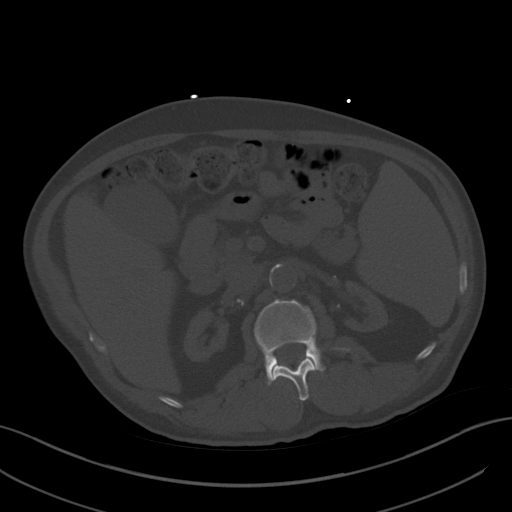
[im 71/91  soft-tissue]
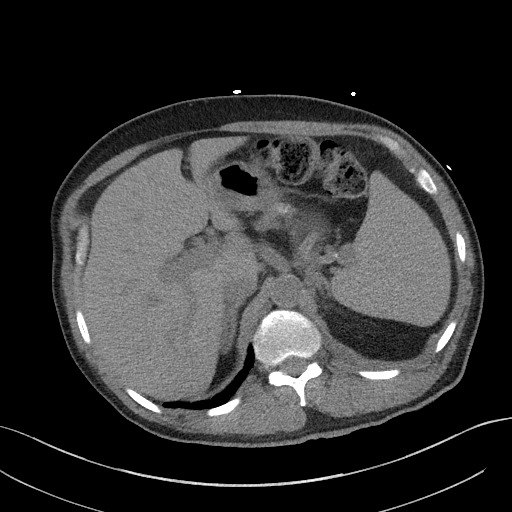
[im 79/91  soft-tissue]
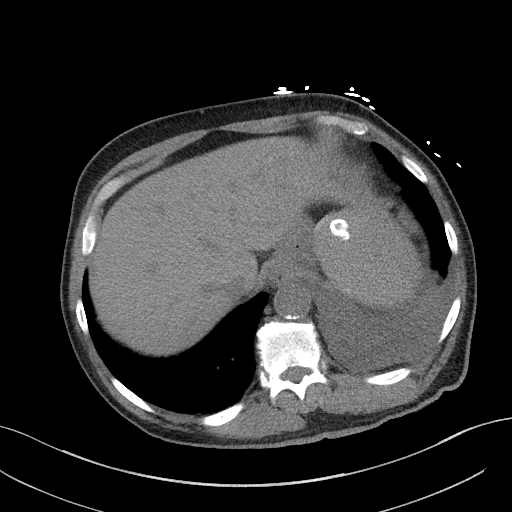
[im 87/91  soft-tissue]
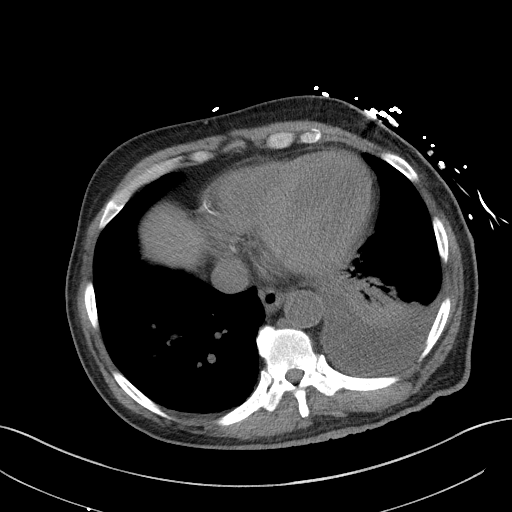

[Series 6: a/p w/o cor · coronal · non-contrast · 0.68mm/px · 3 of 135 slices shown]
[im 45/135  soft-tissue]
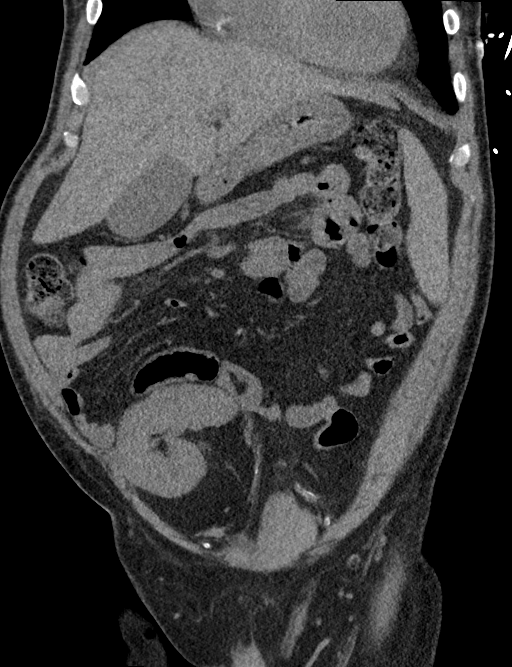
[im 60/135  soft-tissue]
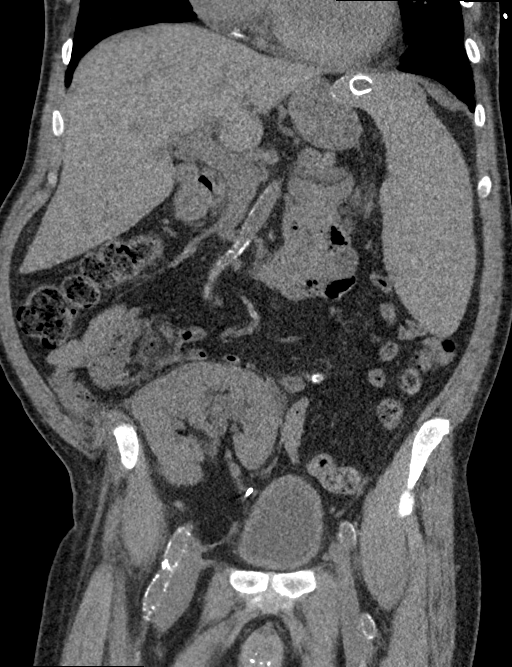
[im 75/135  soft-tissue]
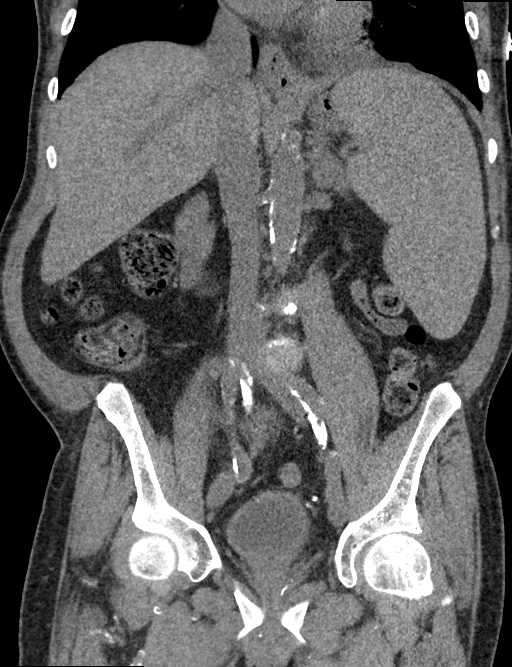

[15 of 46 positions shown; findings below may reference images not displayed]

FINDINGS: Lower chest: Left pleural effusion at least moderate in degree with
adjacent compressive atelectasis. Minimal subsegmental atelectasis
at the right lung base. There are coronary artery calcifications.

Hepatobiliary: Borderline hepatic steatosis without discrete focal
lesion. Gallbladder physiologically distended, no calcified stone.
No biliary dilatation.

Pancreas: No ductal dilatation or inflammation.

Spleen: The spleen is enlarged measuring 18.9 x 13.5 x 8.8 cm
(volume = 9144 cm^3). Peripheral capsular calcifications
superiorly. Rounded calcified density in the upper spleen may be
sequela of remote prior injury.

Adrenals/Urinary Tract: Mild adrenal thickening without dominant
nodule. Atrophic native kidneys without hydronephrosis. Simple cyst
in the posterior right kidney. Transplant kidney in the right iliac
fossa with mild prominence of the renal collecting system but no
frank hydronephrosis. Mild transplant perinephric edema. Urinary
bladder is partially distended, equivocal bladder wall thickening.

Stomach/Bowel: Small hiatal hernia. No bowel wall thickening,
inflammatory change or obstruction. The appendix is normal in
caliber and contains high-density intraluminal material, likely
prior barium or enteric contrast. Moderate colonic stool burden.
There is stool distending the rectum.

Vascular/Lymphatic: Aortic and branch atherosclerosis. No enlarged
abdominal or pelvic lymph nodes.

Reproductive: Prominent prostate gland spanning 5.8 cm.

Other: No free air, free fluid, or intra-abdominal fluid collection.
Subcutaneous densities in the anterior abdominal wall have the
appearance of medication injections sites.

Musculoskeletal: There are no acute or suspicious osseous
abnormalities.
IMPRESSION: 1. Right iliac fossa renal transplant with mild transplant
perinephric edema and prominence of the renal collecting system.
Mild bladder wall thickening. Findings may represent cystitis and
pyelonephritis.
2. No other acute findings in the abdomen/pelvis. Moderate left
pleural effusion, not entirely included in the field of view, of
indeterminate chronicity. Adjacent compressive atelectasis in the
left lower lobe.
3. Splenomegaly.  Borderline hepatic steatosis.
4.  Aortic Atherosclerosis ([id number]).
